# Patient Record
Sex: Female | Born: 1985 | Race: White | Hispanic: No | Marital: Single | Smoking: Never smoker
Health system: Southern US, Community
[De-identification: ages and names within clinical notes are randomized; demographics above are authoritative.]

## PROBLEM LIST (undated history)

## (undated) DIAGNOSIS — D649 Anemia, unspecified: Secondary | ICD-10-CM

## (undated) DIAGNOSIS — N200 Calculus of kidney: Secondary | ICD-10-CM

## (undated) DIAGNOSIS — R42 Dizziness and giddiness: Secondary | ICD-10-CM

## (undated) HISTORY — DX: Calculus of kidney: N20.0

## (undated) HISTORY — PX: KNEE SURGERY: SHX244

## (undated) HISTORY — DX: Anemia, unspecified: D64.9

## (undated) HISTORY — DX: Dizziness and giddiness: R42

---
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
## 2011-10-04 ENCOUNTER — Encounter (HOSPITAL_COMMUNITY): Payer: Self-pay

## 2011-10-04 ENCOUNTER — Emergency Department (HOSPITAL_COMMUNITY)
Admission: EM | Admit: 2011-10-04 | Discharge: 2011-10-04 | Disposition: A | Discharge status: Home / Self Care | Payer: BC Managed Care – PPO | Attending: Emergency Medicine | Admitting: Emergency Medicine

## 2011-10-04 ENCOUNTER — Emergency Department (HOSPITAL_COMMUNITY): Payer: BC Managed Care – PPO

## 2011-10-04 DIAGNOSIS — N1330 Unspecified hydronephrosis: Secondary | ICD-10-CM | POA: Insufficient documentation

## 2011-10-04 DIAGNOSIS — R109 Unspecified abdominal pain: Secondary | ICD-10-CM | POA: Insufficient documentation

## 2011-10-04 DIAGNOSIS — N200 Calculus of kidney: Secondary | ICD-10-CM | POA: Insufficient documentation

## 2011-10-04 DIAGNOSIS — R1031 Right lower quadrant pain: Secondary | ICD-10-CM | POA: Insufficient documentation

## 2011-10-04 LAB — URINALYSIS, ROUTINE W REFLEX MICROSCOPIC
Bilirubin Urine: NEGATIVE
Glucose, UA: NEGATIVE mg/dL
Ketones, ur: 15 mg/dL — AB
Leukocytes, UA: NEGATIVE
Nitrite: NEGATIVE
Protein, ur: NEGATIVE mg/dL
Specific Gravity, Urine: 1.025 (ref 1.005–1.030)
Urobilinogen, UA: 1 mg/dL (ref 0.0–1.0)
pH: 7 (ref 5.0–8.0)

## 2011-10-04 LAB — DIFFERENTIAL
Basophils Absolute: 0 10*3/uL (ref 0.0–0.1)
Basophils Relative: 0 % (ref 0–1)
Lymphocytes Relative: 8 % — ABNORMAL LOW (ref 12–46)
Monocytes Absolute: 0.3 10*3/uL (ref 0.1–1.0)
Neutro Abs: 9.6 10*3/uL — ABNORMAL HIGH (ref 1.7–7.7)

## 2011-10-04 LAB — POCT PREGNANCY, URINE: Preg Test, Ur: NEGATIVE

## 2011-10-04 LAB — BASIC METABOLIC PANEL
CO2: 25 mEq/L (ref 19–32)
Calcium: 9.3 mg/dL (ref 8.4–10.5)
Chloride: 101 mEq/L (ref 96–112)
Creatinine, Ser: 0.6 mg/dL (ref 0.50–1.10)
GFR calc Af Amer: >90 mL/min (ref >90)
Sodium: 137 mEq/L (ref 135–145)

## 2011-10-04 LAB — CBC
HCT: 37.8 % (ref 36.0–46.0)
MCHC: 34.7 g/dL (ref 30.0–36.0)
Platelets: 281 10*3/uL (ref 150–400)
RDW: 13.5 % (ref 11.5–15.5)
WBC: 10.8 10*3/uL — ABNORMAL HIGH (ref 4.0–10.5)

## 2011-10-04 LAB — URINE MICROSCOPIC-ADD ON

## 2011-10-04 IMAGING — CT CT ABD-PELV W/O CM
2 of 4 series · 16 of 46 positions shown, 18 images · non-contrast
Comparison: None.

CLINICAL DATA: Severe right flank pain for 1 day.

CT ABDOMEN AND PELVIS WITHOUT CONTRAST
TECHNIQUE: Multidetector CT imaging of the abdomen and pelvis was
performed following the standard protocol without intravenous
contrast.

[Series 2: stone 160 5.0 b31f st · axial · 0.69mm/px · z∈[-520,-76]mm · 13 of 99 slices shown, 15 images]
[im 5/99  soft-tissue]
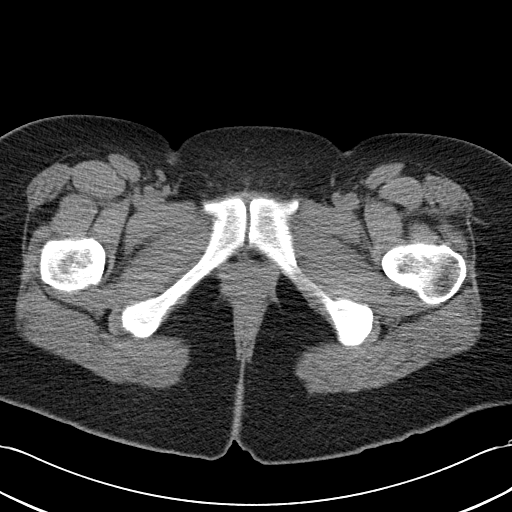
[im 5/99  bone]
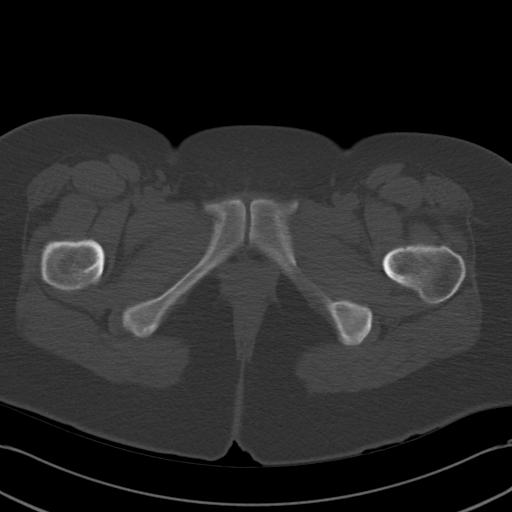
[im 13/99  soft-tissue]
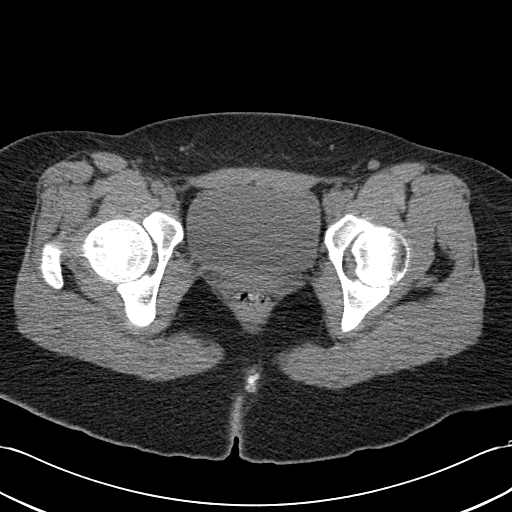
[im 21/99  soft-tissue]
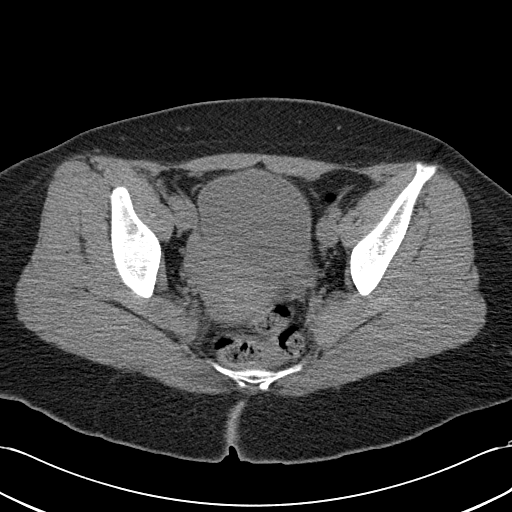
[im 29/99  soft-tissue]
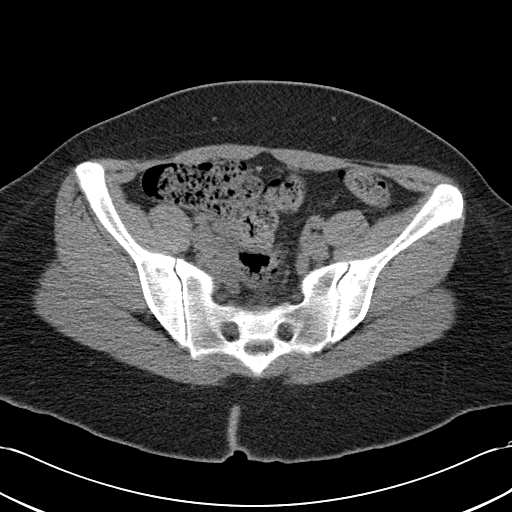
[im 33/99  soft-tissue]
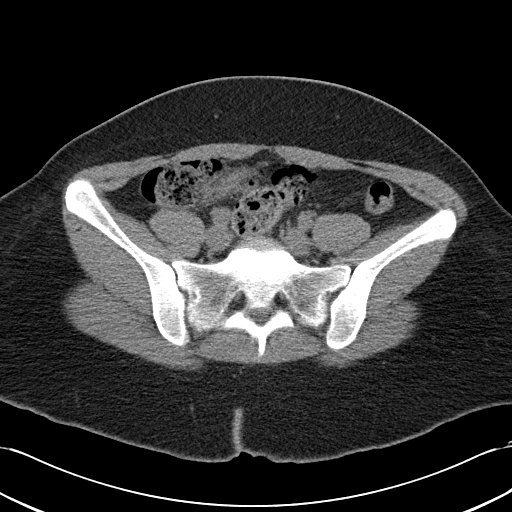
[im 41/99  soft-tissue]
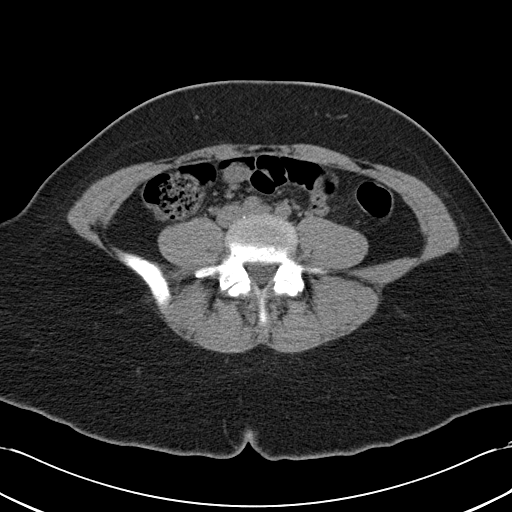
[im 50/99  soft-tissue]
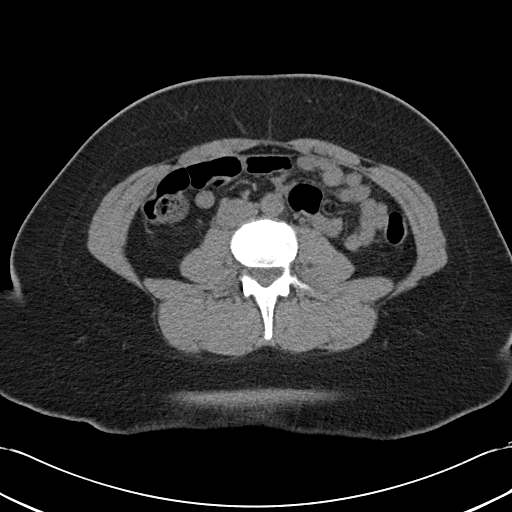
[im 58/99  soft-tissue]
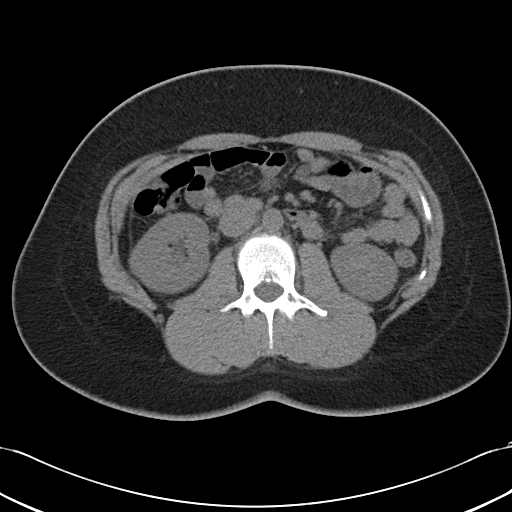
[im 66/99  soft-tissue]
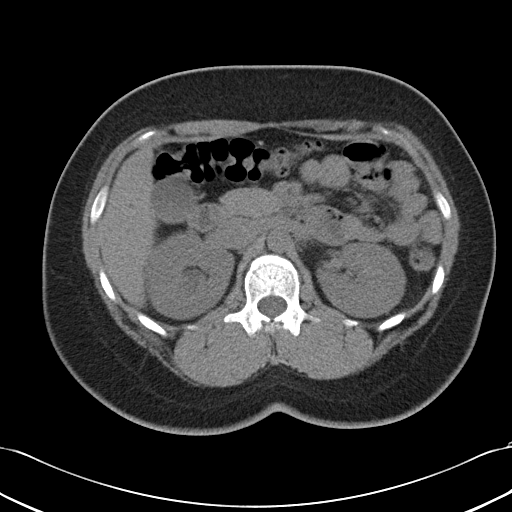
[im 66/99  bone]
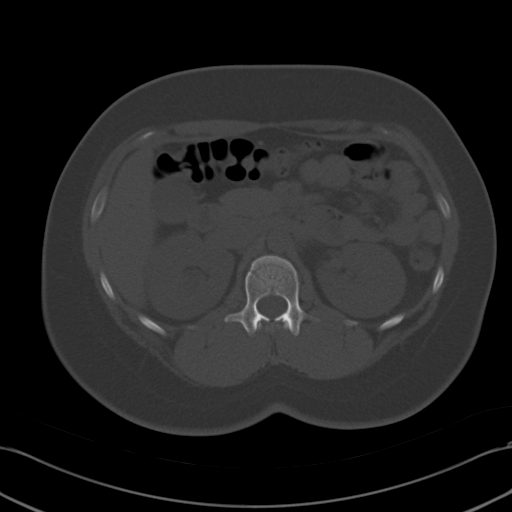
[im 70/99  soft-tissue]
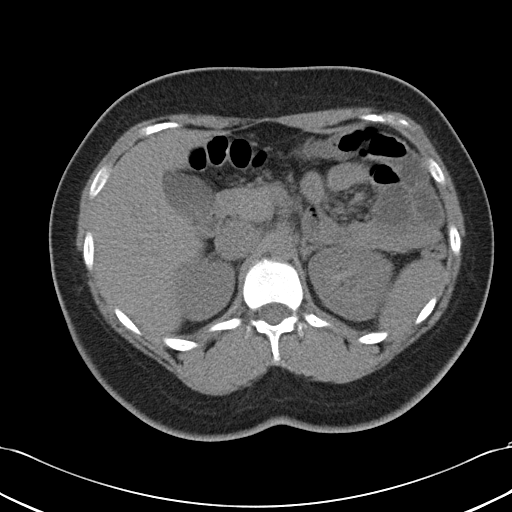
[im 78/99  soft-tissue]
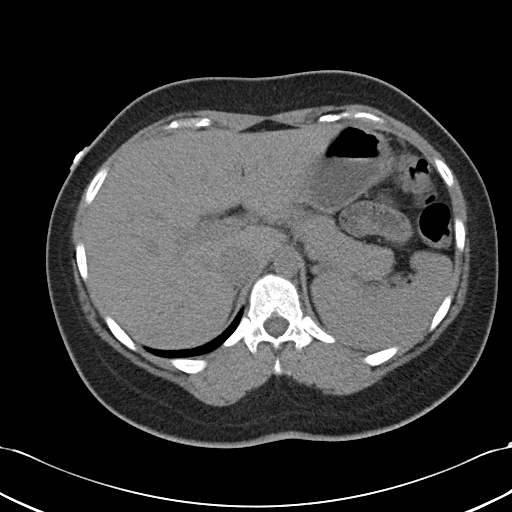
[im 86/99  soft-tissue]
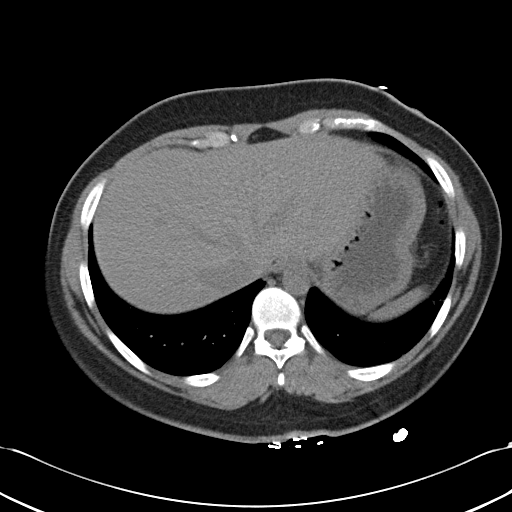
[im 94/99  soft-tissue]
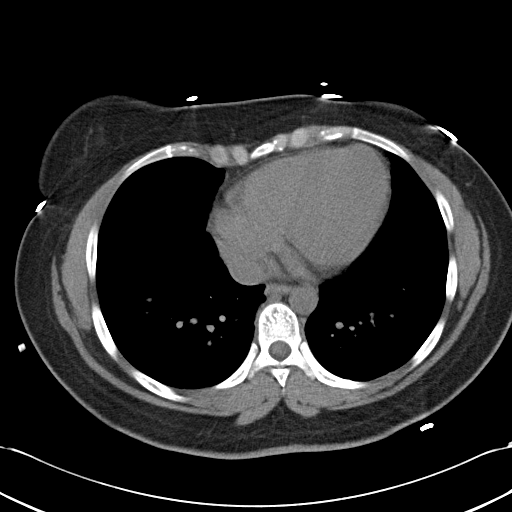

[Series 5: coronals cor · coronal · 0.67mm/px · 3 of 130 slices shown]
[im 44/130  soft-tissue]
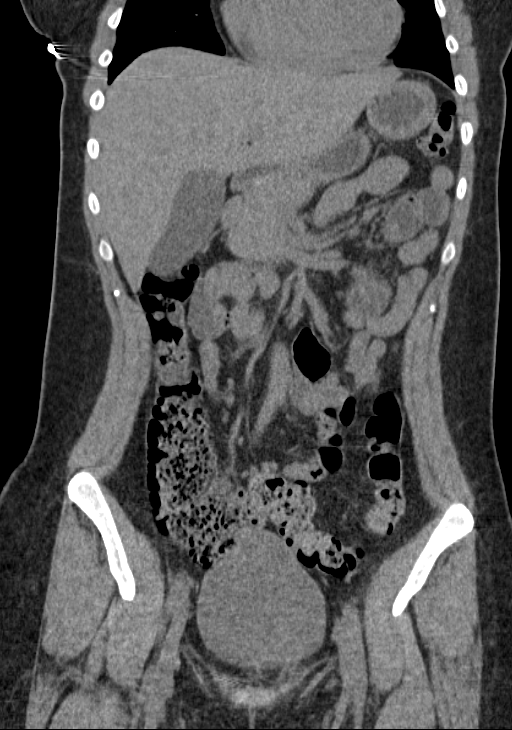
[im 58/130  soft-tissue]
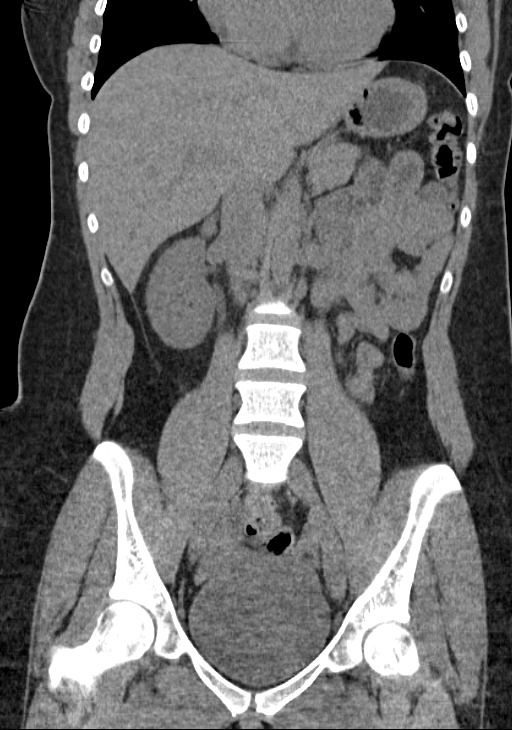
[im 72/130  soft-tissue]
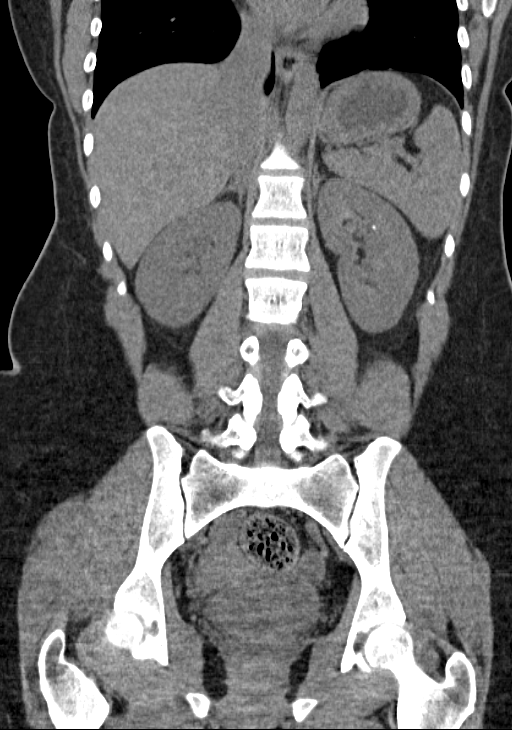

[16 of 46 positions shown; findings below may reference images not displayed]

FINDINGS: Lung bases demonstrate only some dependent atelectasis.
No pleural or pericardial effusion.

Two punctate nonobstructing left renal stones are identified.  No
hydronephrosis or ureteral stone.  The patient has mild right
hydronephrosis.  There is a punctate stone in the proximal right
ureter measuring approximately 0.2 cm.  It is seen on image 48 of
series 2 and is at the level of the L3 vertebral body.  No other
urinary tract stones.

The gallbladder, liver, spleen, adrenal glands and pancreas appear
normal.  No lymphadenopathy or fluid.  Uterus, adnexa and urinary
bladder are unremarkable.  The stomach and small large bowel are
normal in appearance.  No evidence inflammatory process is
identified.  No focal bony abnormality.
IMPRESSION: 1.  Mild right hydronephrosis due to a punctate stone in the right
ureter at the level of L3.
2.  Two punctate nonobstructing left renal stones.

## 2011-10-04 MED ORDER — MORPHINE SULFATE 4 MG/ML IJ SOLN
4.0000 mg | Freq: Once | INTRAMUSCULAR | Status: AC
  Administered 2011-10-04: 4 mg via INTRAVENOUS
  Filled 2011-10-04: qty 1

## 2011-10-04 MED ORDER — ONDANSETRON 8 MG PO TBDP: 8.0000 mg | ORAL_TABLET | Freq: Three times a day (TID) | ORAL | Status: AC | PRN

## 2011-10-04 MED ORDER — SODIUM CHLORIDE 0.9 % IV SOLN: INTRAVENOUS | Status: DC

## 2011-10-04 MED ORDER — KETOROLAC TROMETHAMINE 30 MG/ML IJ SOLN
30.0000 mg | Freq: Once | INTRAMUSCULAR | Status: AC
  Administered 2011-10-04: 30 mg via INTRAVENOUS
  Filled 2011-10-04: qty 1

## 2011-10-04 MED ORDER — TAMSULOSIN HCL 0.4 MG PO CAPS: 0.4000 mg | ORAL_CAPSULE | Freq: Every day | ORAL | Status: DC

## 2011-10-04 MED ORDER — ONDANSETRON HCL 4 MG/2ML IJ SOLN
4.0000 mg | Freq: Once | INTRAMUSCULAR | Status: AC
  Administered 2011-10-04: 4 mg via INTRAVENOUS
  Filled 2011-10-04: qty 2

## 2011-10-04 MED ORDER — SODIUM CHLORIDE 0.9 % IV BOLUS (SEPSIS)
1000.0000 mL | Freq: Once | INTRAVENOUS | Status: AC
  Administered 2011-10-04: 1000 mL via INTRAVENOUS

## 2011-10-04 MED ORDER — OXYCODONE-ACETAMINOPHEN 5-325 MG PO TABS: 1.0000 | ORAL_TABLET | Freq: Four times a day (QID) | ORAL | Status: AC | PRN

## 2011-10-04 NOTE — ED Notes (Signed)
Pt c/o discomfort at IV site, gave her warm compress

## 2011-10-04 NOTE — ED Provider Notes (Signed)
26 year old female who had a sudden onset this morning of severe right flank pain radiating to the right lower abdomen. There is associated nausea and vomiting. No fever or chills. On exam, she is currently in no acute distress. She has mild right CVA tenderness and mild right lower quadrant tenderness. There is no rebound or guarding. Peristalsis is active. Symptoms are suspicious for ureterolithiasis and urinalysis will be obtained as well as CT scan. She'll be given hydromorphone, ketorolac, and ondansetron for symptom control.  Dione Booze, MD 10/04/11 [phone]

## 2011-10-04 NOTE — ED Notes (Signed)
Pt complains of abd pain and flank area pain, sts due anyting fro menstrual cycle but is very irregular, vomiting.

## 2011-10-04 NOTE — ED Provider Notes (Signed)
History     CSN: [id number]  Arrival date & time 10/04/11  1346   First MD Initiated Contact with Patient 10/04/11 1535      Chief Complaint  Patient presents with  . Flank Pain  . Abdominal Pain    (Consider location/radiation/quality/duration/timing/severity/associated sxs/prior treatment) HPI Comments: Patient comes in today with a chief complaint of RLQ abdominal pain and right flank pain.  She feel that the pain begins in the right flank area and radiates to the RLQ.  She describes the pain as sharp and reports that it "comes in waves."  She reports that she has also had three episodes of vomiting today and also some diarrhea earlier today.  She states that she taken Midol for the pain, which helped somewhat.  No prior history of kidney stones.  No prior abdominal surgeries.  No prior history of ovarian cysts.  She denies any hematuria.  She denies any urinary frequency or urgency.  She denies any fever or chills.  She reports that her LMP was sometime at the end of April.  She reports that she is not sexually active.  Her PCP is Dr.  Ludwig Clarks at [facility].  The history is provided by the patient.    History reviewed. No pertinent past medical history.  History reviewed. No pertinent past surgical history.  History reviewed. No pertinent family history.  History  Substance Use Topics  . Smoking status: Never Smoker   . Smokeless tobacco: Not on file  . Alcohol Use: No    OB History    Grav Para Term Preterm Abortions TAB SAB Ect Mult Living                  Review of Systems  Constitutional: Positive for diaphoresis. Negative for fever and chills.  Respiratory: Negative for shortness of breath.   Cardiovascular: Negative for chest pain.  Gastrointestinal: Positive for nausea, vomiting, abdominal pain and diarrhea. Negative for constipation and blood in stool.  Genitourinary: Positive for flank pain. Negative for dysuria, urgency, frequency, hematuria,  decreased urine volume, vaginal bleeding, vaginal discharge, difficulty urinating and menstrual problem.  Neurological: Negative for dizziness, syncope and light-headedness.    Allergies  Review of patient's allergies indicates no known allergies.  Home Medications  No current outpatient prescriptions on file.  BP 119/75  Pulse 43  Temp(Src) 98.1 F (36.7 C) (Oral)  Resp 18  SpO2 97%  LMP 09/01/2011  Physical Exam  Nursing note and vitals reviewed. Constitutional: She appears well-developed and well-nourished.  HENT:  Head: Normocephalic and atraumatic.  Mouth/Throat: Oropharynx is clear and moist.  Neck: Normal range of motion. Neck supple.  Cardiovascular: Normal rate, regular rhythm and normal heart sounds.   Pulmonary/Chest: Effort normal and breath sounds normal.  Abdominal: Soft. Normal appearance and bowel sounds are normal. She exhibits no mass. There is tenderness in the right lower quadrant. There is CVA tenderness. There is no rigidity, no rebound, no guarding and negative Murphy's sign.       Right CVA tenderness  Neurological: She is alert.  Skin: Skin is warm and dry.  Psychiatric: She has a normal mood and affect.    ED Course  Procedures (including critical care time)   Labs Reviewed  URINALYSIS, ROUTINE W REFLEX MICROSCOPIC   Ct Abdomen Pelvis Wo Contrast  10/04/2011  *RADIOLOGY REPORT*  Clinical Data: Severe right flank pain for 1 day.  CT ABDOMEN AND PELVIS WITHOUT CONTRAST  Technique:  Multidetector CT imaging of  the abdomen and pelvis was performed following the standard protocol without intravenous contrast.  Comparison: None.  Findings: Lung bases demonstrate only some dependent atelectasis. No pleural or pericardial effusion.  Two punctate nonobstructing left renal stones are identified.  No hydronephrosis or ureteral stone.  The patient has mild right hydronephrosis.  There is a punctate stone in the proximal right ureter measuring approximately 0.2  cm.  It is seen on image 48 of series 2 and is at the level of the L3 vertebral body.  No other urinary tract stones.  The gallbladder, liver, spleen, adrenal glands and pancreas appear normal.  No lymphadenopathy or fluid.  Uterus, adnexa and urinary bladder are unremarkable.  The stomach and small large bowel are normal in appearance.  No evidence inflammatory process is identified.  No focal bony abnormality.  IMPRESSION:  1.  Mild right hydronephrosis due to a punctate stone in the right ureter at the level of L3. 2.  Two punctate nonobstructing left renal stones.  Original Report Authenticated By: Bernadene Bell. Maricela Curet, M.D.     No diagnosis found.  6:03 PM Reassessed patient.  She reports that her pain and nausea have improved at this time.  MDM  Pt has been diagnosed with a Kidney Stone via CT. There is no evidence of significant hydronephrosis, serum creatine WNL, vitals sign stable and the pt does not have irratractable vomiting. Pt will be dc home with pain medications, antiemetics, and flomax & has been advised to follow up with Urology.          Pascal Lux [place], PA-C 10/05/11 [phone]

## 2011-10-04 NOTE — ED Notes (Signed)
Pt to CT via stretcher.  St's pain has subsided at this time

## 2011-10-04 NOTE — Discharge Instructions (Signed)
You have been diagnosed with kidney stones.  Use your pain medication as prescribed and do not operate heavy machinery while on this medication. Note that your pain medication contains Acetaminophen (Tylenol), therefore it is not recommended to take additional Tylenol while on your pain medication. Continue to drink fluids to help you pass the stones.  Use Zofran for nausea as directed.  Followup with your primary care doctor in regards to your hospital visit.  Return to the ED immediately if you develop fever that persists > 101, uncontrolled pain or vomiting, or other concerns.  Read the instructions below to learn more about kidney stones.  If you do not have a primary care doctor to followup with, use the resource guide attached to help you find one.   Kidney Stones Kidney stones (ureteral lithiasis) are solid masses that form inside your kidneys. The intense pain is caused by the stone moving through the kidney, ureter, bladder, and urethra (urinary tract). When the stone moves, the ureter starts to spasm around the stone. The stone is usually passed in the urine.  HOME CARE  Drink enough fluids to keep your pee (urine) clear or pale yellow. This helps to get the stone out.   Only take medicine as told by your doctor.   Follow up with your doctor as told.  GET HELP RIGHT AWAY IF:   Your pain does not get better with medicine.   You have a fever.   Your pain increases and gets worse over 18 hours.   You have new belly (abdominal) pain.   You feel faint or pass out.  MAKE SURE YOU:   Understand these instructions.   Will watch your condition.   Will get help right away if you are not doing well or get worse.   RESOURCE GUIDE  Dental Problems  Patients with Medicaid: [facility]                     [facility] [address]                                           [address] Phone:  [phone]                                                   Phone:  [phone]  If unable to pay or uninsured, contact:  [facility] or [facility] to become qualified for the adult dental clinic.  Chronic Pain Problems Contact [facility]  [phone] Patients need to be referred by their primary care doctor.  Insufficient Money for Medicine Contact [employer]:  call "211" or [facility] 271-5999.  No Primary Care Doctor Call [facility]  832-8000 Other agencies that provide inexpensive medical care    [facility]  [phone]    [facility]  [phone]    [facility]  [phone]    Women's Clinic  [phone]    [facility]  [phone]    [facility]  [phone]  Psychological Services [facility]  [phone] [facility]  [phone] [facility]   800 853-5163 (emergency services 641-4993)    Substance Abuse Resources [facility]  336-882-2125 [facility] 336-784-9470 [facility] 336-285-9073 [facility] 336-845-3988 Residential & Outpatient Substance Abuse Program  800-659-3381  Abuse/Neglect [place] Child Abuse Hotline (336) 641-3795 [place] Child Abuse Hotline 800-378-5315 (After Hours)  Emergency Shelter [facility] (336) 271-5985  [facility] (336) 275-9566 [facility] (704) 372-4663  MRSA Hotline #:   832-7006    [place] Resources  [facility] [place]     [employer]                          [facility] [address] [place]                       [address]      [address]                                                  [place]                            [place] Phone:  [phone]                                   Phone:  [phone]                 Phone:  [phone]  [facility] Phone:   [phone]  [place] Child Abuse Hotline (336) 342-1394 (336) 342-3537 (After Hours)   

## 2011-10-08 NOTE — ED Provider Notes (Signed)
Medical screening examination/treatment/procedure(s) were conducted as a shared visit with non-physician practitioner(s) and myself.  I personally evaluated the patient during the encounter   Dione Booze, MD 10/08/11 [phone]

## 2018-06-20 ENCOUNTER — Other Ambulatory Visit (HOSPITAL_COMMUNITY)
Admission: RE | Admit: 2018-06-20 | Discharge: 2018-06-20 | Disposition: A | Discharge status: Home / Self Care | Payer: BC Managed Care – PPO | Source: Ambulatory Visit | Attending: Obstetrics and Gynecology | Admitting: Obstetrics and Gynecology

## 2018-06-20 ENCOUNTER — Other Ambulatory Visit: Payer: Self-pay | Admitting: Obstetrics and Gynecology

## 2018-06-20 DIAGNOSIS — Z01419 Encounter for gynecological examination (general) (routine) without abnormal findings: Secondary | ICD-10-CM | POA: Insufficient documentation

## 2018-06-22 LAB — CYTOLOGY - PAP
DIAGNOSIS: NEGATIVE
HPV: NOT DETECTED

## 2021-05-19 ENCOUNTER — Ambulatory Visit: Payer: BC Managed Care – PPO | Admitting: Neurology

## 2021-05-19 ENCOUNTER — Other Ambulatory Visit: Payer: Self-pay

## 2021-05-19 ENCOUNTER — Encounter: Payer: Self-pay | Admitting: Neurology

## 2021-05-19 VITALS — BP 128/79 | HR 68 | Resp 20 | Ht 65.0 in | Wt 259.0 lb

## 2021-05-19 DIAGNOSIS — R55 Syncope and collapse: Secondary | ICD-10-CM

## 2021-05-19 NOTE — Progress Notes (Signed)
NEUROLOGY CONSULTATION NOTE  Alexis Yoder Flenner MRN: [id number] DOB: [birth year]  Referring provider: Dr. Garth BignessKathryn Timberlake Primary care provider: Dr. Garth BignessKathryn Timberlake  Reason for consult:  2 episodes of syncope  Dear Dr Chanetta Marshallimberlake:  Thank you for your kind referral of Alexis Yoder Pfister for consultation of the above symptoms. Although her history is well known to you, please allow me to reiterate it for the purpose of our medical record. The patient was accompanied to the clinic by her mother who also provides collateral information. Records and images were personally reviewed where available.   HISTORY OF PRESENT ILLNESS: This is a pleasant 36 year old right-handed woman with no significant past medical history, presenting for evaluation of 2 episodes of syncope. She is accompanied by her mother who helps supplement the history today. She recalls having a syncopal episode in middle school, her mother was braiding her hair by the sink and she fell forward. She recalls that she was getting ready to start her period at that time. She only recalls feeling sleepy, no focal weakness. She had another syncopal episode in late high school/early college with no prior warning, she was told she was anemic and took iron pills for a time. She did well for several years until 04/23/2021. She recalls feeling fine that day, she saw her chiropractor who did adjustments (in her back and neck), then stopped by [employer] for food. She recalls feeling a little nauseated in the car, which is not unusual with history of carsickness. They were visiting her grandmother in [place] and arrived there feeling hungry. She had dinner and then while sitting felt like the room was spinning. She alerted her mother and recalls feeling very warm. Her mother put a wet washcloth on, she felt fine for 30 minutes and started crocheting, when she again felt the same sensation and lost consciousness, waking up to her mother calling her.  Her mother reports that she was sitting and then her head went down and her arms flexed on her chest, no convulsive activity. Episode lasted 15-20 seconds. She woke up and felt warm again, vomiting twice en route to the ER. She recalls having a head CT and EKG that were reportedly normal, diagnosed with vertigo and discharged home on meclizine. Over the next month, she would have brief episodes of feeling "swimmyheaded" and would take meclizine which seemed to curb sensation. No further spinning sensation until 05/10/2021. She felt fine, she had been up and down steps helping pack the house. At dinner, she ate and was sitting on the recliner when she felt the same spinning sensation. She took meclizine and felt fine for 10-20 minutes. She got up and sat on the kitchen chair then felt the spinning sensation and again lost consciousness also for only a few seconds. She did not fall off the chair, she was able to lay her head on the back of the chair and felt warm and nauseated when she woke up. Her body felt like she had run a marathon, she slept until the next day. There was a little burning sensation in her chest, no palpitations. She still has a little bit of the swimmy-headed sensation right now when she moves her head back and forth. 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 Her mother denies any staring/unresponsive  episodes. She denies any focal numbness/tingling/weakness, myoclonic jerks. She has occasional tension headaches. She denies any neck pain. She usually gets 6-7 hours of sleep, melatonin helps. She denies any sleep deprivation or alcohol prior to the events. Memory is really good. 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 She had a normal birth and early development.  There is no  history of febrile convulsions, CNS infections such as meningitis/encephalitis, significant traumatic brain injury, neurosurgical procedures, or family history of seizures.   PAST MEDICAL HISTORY: History reviewed. No pertinent past medical history.  PAST SURGICAL HISTORY: History reviewed. No pertinent surgical history.  MEDICATIONS: Current Outpatient Medications on File Prior to Visit  Medication Sig Dispense Refill   cetirizine (ZYRTEC) 10 MG tablet 1 tablet     fluticasone (FLONASE) 50 MCG/ACT nasal spray fluticasone propionate 50 mcg/actuation nasal spray,suspension     meclizine (ANTIVERT) 25 MG tablet Take 25 mg by mouth 3 (three) times daily as needed.     Melatonin 10 MG CAPS Take by mouth. Takes 2 gummys at hs     Multiple Vitamin (MULTIVITAMIN) tablet Take 1 tablet by mouth daily.     No current facility-administered medications on file prior to visit.    ALLERGIES: Allergies  Allergen Reactions   Nickel Rash and Other (See Comments)    FAMILY HISTORY: History reviewed. No pertinent family history.  SOCIAL HISTORY: Social History   Socioeconomic History   Marital status: Single    Spouse name: Not on file   Number of children: 0   Years of education: Not on file   Highest education level: Not on file  Occupational History   Occupation: teacher special education  Tobacco Use   Smoking status: Never   Smokeless tobacco: Not on file  Vaping Use   Vaping Use: Never used  Substance and Sexual Activity   Alcohol use: No   Drug use: No   Sexual activity: Not on file  Other Topics Concern   Not on file  Social History Narrative   Right handed   Drinks caffeine   One story home   Social Determinants of Health   Financial Resource Strain: Not on file  Food Insecurity: Not on file  Transportation Needs: Not on file  Physical Activity: Not on file  Stress: Not on file  Social Connections: Not on file  Intimate Partner Violence: Not on file      PHYSICAL EXAM: Vitals:   05/19/21 1259  BP: 128/79  Pulse: 68  Resp: 20  SpO2: 98%   General: No acute distress Head:  Normocephalic/atraumatic Skin/Extremities: No rash, no edema Neurological Exam: Mental status: alert and oriented to person, place, and time, no dysarthria or aphasia, Fund of knowledge is appropriate.  Recent and remote memory are intact, 3/3 delayed recall.  Attention and concentration are normal, 5/5 delayed recall. Cranial nerves: CN I: not tested CN II: pupils equal, round and reactive to light, visual fields intact CN III, IV, VI:  full range of motion, no nystagmus, no ptosis CN V: facial sensation intact CN VII: upper and lower face symmetric CN VIII: hearing intact to conversation Bulk & Tone: normal, no fasciculations. Motor: 5/5 throughout with no pronator drift. Sensation: intact to light touch, cold, pin, vibration sense.  No extinction to double simultaneous stimulation.  Romberg test negative Deep Tendon Reflexes: +2 throughout, no ankle clonus Plantar responses: downgoing bilaterally Cerebellar: no incoordination on finger to nose testing Gait: narrow-based and  steady, able to tandem walk adequately. Tremor: none   IMPRESSION: This is a pleasant 36 year old right-handed woman with no significant past medical history, presenting for evaluation of 2 episodes of syncope. She reports smelling something burning and having a wave of spinning sensation prior to the episodes, raising concern for seizure, however the brief nature and post-event symptoms also raise possibility of other causes of syncope. From a neurological standpoint, MRI brain with and without contrast and 1-hour EEG will be ordered. If normal, we will do a 24-hour EEG. She will also be referred to Cardiology for evaluation of recurrent syncope. [place] driving laws were discussed with the patient, and she knows to stop driving after an episode of loss of consciousness until 6 months  event-free. Follow-up after tests, call for any changes.    Thank you for allowing me to participate in the care of this patient. Please do not hesitate to call for any questions or concerns.   Patrcia Dolly, M.D.  CC: Dr. Chanetta Marshall

## 2021-05-19 NOTE — Patient Instructions (Signed)
Good to meet you!  Schedule MRI brain with and without contrast  2. Schedule 1-hour EEG, if normal we will do a 24-hour EEG  3. Refer to Cardiology  4. As per [place] driving laws, no driving after an episode of loss of consciousness until 6 months event-free  5. Follow-up after tests, call for any changes

## 2021-05-20 ENCOUNTER — Other Ambulatory Visit: Payer: Self-pay

## 2021-05-20 DIAGNOSIS — R4020 Unspecified coma: Secondary | ICD-10-CM

## 2021-05-21 ENCOUNTER — Other Ambulatory Visit: Payer: Self-pay

## 2021-05-21 ENCOUNTER — Ambulatory Visit (INDEPENDENT_AMBULATORY_CARE_PROVIDER_SITE_OTHER): Payer: BC Managed Care – PPO | Admitting: Neurology

## 2021-05-21 DIAGNOSIS — R55 Syncope and collapse: Secondary | ICD-10-CM | POA: Diagnosis not present

## 2021-05-27 ENCOUNTER — Telehealth: Payer: Self-pay | Admitting: Neurology

## 2021-05-27 NOTE — Telephone Encounter (Signed)
Patient called stating she is just following up from her EEG that she had done.

## 2021-05-28 NOTE — Telephone Encounter (Signed)
Pls let her know the 1-hour EEG was normal, it is not like a pregnancy test that is positive or negative, just a snapshot of her brainwaves during the study. Proceed with 24-hour EEG as discussed, thanks

## 2021-05-28 NOTE — Telephone Encounter (Signed)
Pt called an informed 1-hour EEG was normal, it is not like a pregnancy test that is positive or negative, just a snapshot of her brainwaves during the study. Proceed with 24-hour EEG as discussed. Pt verbalized understanding

## 2021-05-28 NOTE — Procedures (Signed)
ELECTROENCEPHALOGRAM REPORT  Date of Study: 05/21/2021  Patient's Name: Alexis Yoder MRN: [id number] Date of Birth: 03/29/1986  Referring Provider: Dr. Patrcia Dolly  Clinical History: This is a 36 year old woman with 2 episodes of syncope preceded by spinning sensation. EEG for classification.  Medications: meclizine  Technical Summary: A multichannel digital 1-hour EEG recording measured by the international 10-20 system with electrodes applied with paste and impedances below 5000 ohms performed in our laboratory with EKG monitoring in an awake and asleep patient.  Hyperventilation was not performed. Photic stimulation was performed.  The digital EEG was referentially recorded, reformatted, and digitally filtered in a variety of bipolar and referential montages for optimal display.    Description: The patient is awake and asleep during the recording.  During maximal wakefulness, there is a symmetric, medium voltage 10 Hz posterior dominant rhythm that attenuates with eye opening.  The record is symmetric.  During drowsiness and sleep, there is an increase in theta slowing of the background.  Vertex waves and symmetric sleep spindles were seen.  Photic stimulation did not elicit any abnormalities.  There were no epileptiform discharges or electrographic seizures seen.    EKG lead was unremarkable.  Impression: This 1-hour awake and asleep EEG is normal.    Clinical Correlation: A normal EEG does not exclude a clinical diagnosis of epilepsy.  If further clinical questions remain, prolonged EEG may be helpful.  Clinical correlation is advised.   Patrcia Dolly, M.D.

## 2021-05-30 ENCOUNTER — Ambulatory Visit: Payer: BC Managed Care – PPO | Admitting: Cardiology

## 2021-05-30 ENCOUNTER — Encounter: Payer: Self-pay | Admitting: Cardiology

## 2021-05-30 ENCOUNTER — Other Ambulatory Visit: Payer: Self-pay

## 2021-05-30 VITALS — BP 135/84 | HR 75 | Temp 98.3°F | Resp 16 | Ht 65.0 in | Wt 259.8 lb

## 2021-05-30 DIAGNOSIS — R42 Dizziness and giddiness: Secondary | ICD-10-CM

## 2021-05-30 DIAGNOSIS — R55 Syncope and collapse: Secondary | ICD-10-CM

## 2021-05-30 DIAGNOSIS — Z6841 Body Mass Index (BMI) 40.0 and over, adult: Secondary | ICD-10-CM

## 2021-05-30 DIAGNOSIS — R4020 Unspecified coma: Secondary | ICD-10-CM

## 2021-05-30 NOTE — Progress Notes (Signed)
Date:  05/30/2021   ID:  Alexis Yoder, DOB [birth year], MRN [id number]  PCP:  Glenis Smoker, MD  Cardiologist:  Rex Kras, DO, [facility] (established care May 30, 2021)  REASON FOR CONSULT: Syncope  REQUESTING PHYSICIAN:  Glenis Smoker, MD [address] [place],  [place] [address]  Chief Complaint  Patient presents with   Near Syncope   New Patient (Initial Visit)    HPI  Alexis Yoder is a 35 y.o. Caucasian female who presents to the office with a chief complaint of " syncope work-up and evaluation." Patient's past medical history and cardiovascular risk factors include: Obesity due to excess calories, Vertigo.   She is referred to the office at the request of Glenis Smoker, * for evaluation of Syncope.  This patient is accompanied in the office by her  mom . Alexis Yoder provides verbal consent with regards to having her present during today's encounter.  On April 23, 2021 patient had gone to her chiropractor who worked on her back and neck and later that day she was in the car traveling to [place] to visit family.  When she arrived to her grandma's place she was hungry they sat down to eat dinner that is when she started experiencing her head spinning, feeling warm/flushed.  Her mother placed a warm washcloth on her forehead she felt fine for the first half hour only for symptoms to recur and while seated lost consciousness for approximately 15 to 20 seconds according to the patient's mother who witnessed the event.  In route to the ED patient had 2 episodes of vomiting.  She underwent a work-up at the local ED and was diagnosed with vertigo and sent home on meclizine.  She had another episode of syncope on May 10, 2021.  After eating dinner and sitting on the recliner he started having symptoms of spinning like sensation she took a meclizine shortly thereafter and the symptoms resolved.  When she got up and sat on the kitchen chair she had  reoccurrence of symptoms and then lost consciousness only for a few seconds.  Patient's mother notes that the recent 2 episodes of syncope have been occurring after eating a meal, usually while she is sitting, prodromal symptoms including sensation of spinning and feeling warm/flushed, and aura of something burning.  Patient denies any lightheadedness, dizziness, tunnel vision, nausea, or loss of bowel or bladder function.  Patient has been referred to neurology and plans to undergo EEG and MRI of the brain.  And also referred to cardiology for further evaluation.  She used to have a history of anemia while growing up which is now resolved.  No prior thyroid disease.    Patient states that she is usually not well hydrated and could improve her overall hydration.  Consumes approximately 1 soda daily, and 12 ounce of coffee daily.  No use of herbal supplements, energy drinks, stimulants, or weight loss supplements.  No recent sick contacts.  No family history of premature CAD/cardiomyopathy/sudden cardiac death.  FUNCTIONAL STATUS: No structured exercise program or daily routine.   ALLERGIES: Allergies  Allergen Reactions   Nickel Rash and Other (See Comments)    MEDICATION LIST PRIOR TO VISIT: Current Meds  Medication Sig   cetirizine (ZYRTEC) 10 MG tablet 1 tablet   fluticasone (FLONASE) 50 MCG/ACT nasal spray fluticasone propionate 50 mcg/actuation nasal spray,suspension   meclizine (ANTIVERT) 25 MG tablet Take 25 mg by mouth 3 (three) times daily as needed.  Melatonin 10 MG CAPS Take by mouth. Takes 2 gummys at hs   Multiple Vitamin (MULTIVITAMIN) tablet Take 1 tablet by mouth daily.     PAST MEDICAL HISTORY: Past Medical History:  Diagnosis Date   Anemia    Kidney stone    Vertigo     PAST SURGICAL HISTORY: Past Surgical History:  Procedure Laterality Date   KNEE SURGERY Left     FAMILY HISTORY: The patient family history includes Breast cancer in her mother;  Diabetes in her father.  SOCIAL HISTORY:  The patient  reports that she has never smoked. She does not have any smokeless tobacco history on file. She reports current alcohol use. She reports that she does not use drugs.  REVIEW OF SYSTEMS: Review of Systems  Constitutional: Negative for chills and fever.  HENT:  Negative for hoarse voice and nosebleeds.   Eyes:  Negative for discharge, double vision and pain.  Cardiovascular:  Positive for syncope (Two since Dec 2022). Negative for chest pain, claudication, dyspnea on exertion, leg swelling, near-syncope, orthopnea, palpitations and paroxysmal nocturnal dyspnea.  Respiratory:  Negative for hemoptysis and shortness of breath.   Musculoskeletal:  Negative for muscle cramps and myalgias.  Gastrointestinal:  Negative for abdominal pain, constipation, diarrhea, hematemesis, hematochezia, melena, nausea and vomiting.  Neurological:  Positive for vertigo. Negative for dizziness and light-headedness.  All other systems reviewed and are negative.  PHYSICAL EXAM: Vitals with BMI 05/30/2021 05/19/2021 10/04/2011  Height 5\' 5"  5\' 5"  -  Weight 259 lbs 13 oz 259 lbs -  BMI [id number] -  Systolic [id number]  Diastolic 84 79 65  Pulse 75 68 56   Orthostatic VS for the past 72 hrs (Last 3 readings):  Orthostatic BP Patient Position BP Location Cuff Size Orthostatic Pulse  05/30/21 1105 123/80 Standing Left Arm Large 66  05/30/21 1104 130/80 Sitting Left Arm Large 65  05/30/21 1103 128/81 Supine Left Arm Large 57   CONSTITUTIONAL: Well-developed and well-nourished. No acute distress.  SKIN: Skin is warm and dry. No rash noted. No cyanosis. No pallor. No jaundice HEAD: Normocephalic and atraumatic.  EYES: No scleral icterus MOUTH/THROAT: Moist oral membranes.  NECK: No JVD present. No thyromegaly noted. No carotid bruits  LYMPHATIC: No visible cervical adenopathy.  CHEST Normal respiratory effort. No intercostal retractions  LUNGS: Clear to  auscultation bilaterally.  No stridor. No wheezes. No rales.  CARDIOVASCULAR: Regular rate and rhythm, positive S1-S2, no murmurs rubs or gallops appreciated. ABDOMINAL: Obese, soft, nontender, nondistended, positive bowel sounds all 4 quadrants, no apparent ascites.  EXTREMITIES: No peripheral edema, warm to touch, 2+ bilateral DP and PT pulses HEMATOLOGIC: No significant bruising NEUROLOGIC: Oriented to person, place, and time. Nonfocal. Normal muscle tone.  PSYCHIATRIC: Normal mood and affect. Normal behavior. Cooperative  CARDIAC DATABASE: EKG: 05/30/2021: Sinus bradycardia, 52 bpm, without underlying ischemia or injury pattern.  Echocardiogram: No results found for this or any previous visit from the past 1095 days.    Stress Testing: No results found for this or any previous visit from the past 1095 days.   Heart Catheterization: None  LABORATORY DATA: CBC Latest Ref Rng & Units 10/04/2011  WBC 4.0 - 10.5 K/uL 10.8(H)  Hemoglobin 12.0 - 15.0 g/dL 13.1  Hematocrit 36.0 - 46.0 % 37.8  Platelets 150 - 400 K/uL 281    CMP Latest Ref Rng & Units 10/04/2011  Glucose 70 - 99 mg/dL 107(H)  BUN 6 - 23 mg/dL 8  Creatinine 0.50 - 1.10  mg/dL 2.130.60  Sodium 086135 - 578145 mEq/L 137  Potassium 3.5 - 5.1 mEq/L 4.0  Chloride 96 - 112 mEq/L 101  CO2 19 - 32 mEq/L 25  Calcium 8.4 - 10.5 mg/dL 9.3    Lipid Panel  No results found for: CHOL, TRIG, HDL, CHOLHDL, VLDL, LDLCALC, LDLDIRECT, LABVLDL  No components found for: NTPROBNP No results for input(s): PROBNP in the last 8760 hours. No results for input(s): TSH in the last 8760 hours.  BMP No results for input(s): NA, K, CL, CO2, GLUCOSE, BUN, CREATININE, CALCIUM, GFRNONAA, GFRAA in the last 8760 hours.  HEMOGLOBIN A1C No results found for: HGBA1C, MPG  External Labs: Collected: 05/15/2021 available in Care Everywhere. TSH 2.62. Hemoglobin 13.5 g/dL, hematocrit 46.9%40.2%. BUN 14, creatinine 0.9. Sodium 139, potassium 4, chloride 102,  bicarb 30. AST 26, ALT 41, alkaline phosphatase 95.  IMPRESSION:    ICD-10-CM   1. Loss of consciousness (HCC)  R40.20 MR ANGIO NECK W WO CONTRAST    EKG 12-Lead       RECOMMENDATIONS: Guillermina CityMaggie C Yoder is a 36 y.o. Caucasian female whose past medical history and cardiac risk factors include: Obesity due to excess calories, vertigo.  Syncope and collapse Based on her symptoms appears to be having vasovagal episodes. However given her the aura of burning sensation she is currently being worked up for seizures by neurology. Hemoglobin within acceptable range. TSH within acceptable range. Orthostatic vital signs negative. ECG unremarkable to explain her symptoms. No identifiable reversible cause. Educated on importance of hydration, avoiding triggers, compression stockings. Echocardiogram will be ordered to evaluate for structural heart disease and left ventricular systolic function. Extended Holter monitor to evaluate for dysrhythmias. Educated on continuing neurology work-up as instructed/recommended. Patient aware of driving restrictions with regards to syncope as instructed by her neurologist.   As part of this initial consultation reviewed outside records available in epic, Care Everywhere which included notes, EKG, labs these findings have been summarized and noted above for further reference.  Discussed disease management, ordering diagnostic testing, coordination of care and patient education provided as a part of today's encounter.  Further recommendations to follow.  Patient and her mom's questions and concerns addressed to her satisfaction.  FINAL MEDICATION LIST END OF ENCOUNTER: No orders of the defined types were placed in this encounter.   There are no discontinued medications.   Current Outpatient Medications:    cetirizine (ZYRTEC) 10 MG tablet, 1 tablet, Disp: , Rfl:    fluticasone (FLONASE) 50 MCG/ACT nasal spray, fluticasone propionate 50 mcg/actuation nasal  spray,suspension, Disp: , Rfl:    meclizine (ANTIVERT) 25 MG tablet, Take 25 mg by mouth 3 (three) times daily as needed., Disp: , Rfl:    Melatonin 10 MG CAPS, Take by mouth. Takes 2 gummys at hs, Disp: , Rfl:    Multiple Vitamin (MULTIVITAMIN) tablet, Take 1 tablet by mouth daily., Disp: , Rfl:   Orders Placed This Encounter  Procedures   EKG 12-Lead    There are no Patient Instructions on file for this visit.   --Continue cardiac medications as reconciled in final medication list. --No follow-ups on file. Or sooner if needed. --Continue follow-up with your primary care physician regarding the management of your other chronic comorbid conditions.  Patient's questions and concerns were addressed to her satisfaction. She voices understanding of the instructions provided during this encounter.   This note was created using a voice recognition software as a result there may be grammatical errors inadvertently enclosed that do  not reflect the nature of this encounter. Every attempt is made to correct such errors.  Rex Kras, [place], [facility]  Pager: [phone] Office: [phone]

## 2021-06-01 ENCOUNTER — Encounter: Payer: Self-pay | Admitting: Cardiology

## 2021-06-01 ENCOUNTER — Encounter: Payer: Self-pay | Admitting: Neurology

## 2021-06-02 NOTE — Telephone Encounter (Signed)
From patient.

## 2021-06-05 ENCOUNTER — Ambulatory Visit
Admission: RE | Admit: 2021-06-05 | Discharge: 2021-06-05 | Disposition: A | Discharge status: Home / Self Care | Payer: BC Managed Care – PPO | Source: Ambulatory Visit | Attending: Neurology | Admitting: Neurology

## 2021-06-05 ENCOUNTER — Telehealth: Payer: Self-pay | Admitting: Neurology

## 2021-06-05 ENCOUNTER — Other Ambulatory Visit: Payer: Self-pay | Admitting: Neurology

## 2021-06-05 ENCOUNTER — Other Ambulatory Visit: Payer: Self-pay

## 2021-06-05 DIAGNOSIS — R55 Syncope and collapse: Secondary | ICD-10-CM

## 2021-06-05 DIAGNOSIS — R4020 Unspecified coma: Secondary | ICD-10-CM

## 2021-06-05 NOTE — Telephone Encounter (Signed)
Patient had an MRI today, was unable to do it. She will need something to calm her down when she does her next MRI.

## 2021-06-05 NOTE — Telephone Encounter (Signed)
Was this an open MRI? Pls order open MRI and we will send in for Valium, thanks

## 2021-06-06 ENCOUNTER — Telehealth: Payer: Self-pay | Admitting: Neurology

## 2021-06-06 MED ORDER — DIAZEPAM 5 MG PO TABS: ORAL_TABLET | ORAL | 0 refills | Status: DC

## 2021-06-06 NOTE — Addendum Note (Signed)
Addended by: Cameron Sprang on: 06/06/2021 01:08 PM   Modules accepted: Orders

## 2021-06-06 NOTE — Telephone Encounter (Signed)
Patient called and said she did not complete her MRI this other day due to claustrophobia.  She has rescheduled the appointment  and is requesting something to be sent in for anxiety to help her complete the visit.  [facility] on [address]

## 2021-06-06 NOTE — Telephone Encounter (Signed)
Pt called no answer left a voice mail per DPR that  informed her that medication to help her relax for MRI was called into the pharmacy

## 2021-06-06 NOTE — Telephone Encounter (Signed)
Rx sent for Valium prior to MRI

## 2021-06-06 NOTE — Telephone Encounter (Signed)
See other phone note

## 2021-06-11 ENCOUNTER — Ambulatory Visit: Payer: BC Managed Care – PPO

## 2021-06-11 ENCOUNTER — Inpatient Hospital Stay: Payer: BC Managed Care – PPO

## 2021-06-11 ENCOUNTER — Other Ambulatory Visit: Payer: Self-pay

## 2021-06-11 DIAGNOSIS — R55 Syncope and collapse: Secondary | ICD-10-CM

## 2021-06-14 ENCOUNTER — Encounter: Payer: Self-pay | Admitting: Neurology

## 2021-06-14 ENCOUNTER — Encounter: Payer: Self-pay | Admitting: Cardiology

## 2021-06-16 NOTE — Telephone Encounter (Signed)
From patient.

## 2021-06-17 ENCOUNTER — Telehealth: Payer: Self-pay

## 2021-06-17 NOTE — Telephone Encounter (Signed)
Patients live monitor was denied, per ST ok to use non live monitor . Patient coming in 06/23/21 for monitor

## 2021-06-19 ENCOUNTER — Ambulatory Visit
Admission: RE | Admit: 2021-06-19 | Discharge: 2021-06-19 | Disposition: A | Discharge status: Home / Self Care | Payer: BC Managed Care – PPO | Source: Ambulatory Visit | Attending: Neurology | Admitting: Neurology

## 2021-06-19 DIAGNOSIS — R4020 Unspecified coma: Secondary | ICD-10-CM

## 2021-06-19 DIAGNOSIS — R55 Syncope and collapse: Secondary | ICD-10-CM

## 2021-06-19 IMAGING — MR MR MRA HEAD W/O CM
13 series · 43 of 48 positions shown · IV contrast (20 ml multihance)
Comparison: Report from head CT [DATE] (images unavailable).

CLINICAL DATA: Syncope, unspecified syncope type. Additional
history provided by scanning technologist: Patient reports 3
syncopal episodes in the last 6 weeks.

EXAM:
MRI HEAD WITHOUT AND WITH CONTRAST
MRA HEAD WITHOUT CONTRAST
TECHNIQUE: Multiplanar, multi-echo pulse sequences of the brain and surrounding
structures were acquired without and with intravenous contrast.
Angiographic images of the Circle of Willis were acquired using MRA
technique without intravenous contrast.
CONTRAST:  20mL MULTIHANCE GADOBENATE DIMEGLUMINE 529 MG/ML IV SOLN

[Series 2: T1 · sagittal · 5.0mm · 0.45mm/px · 2 of 25 slices shown]
[im 1/25]
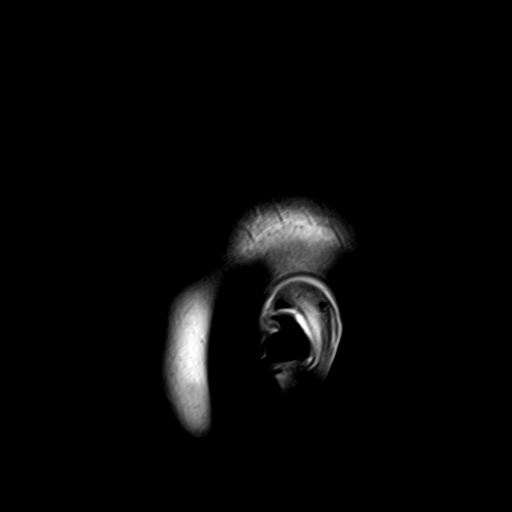
[im 25/25]
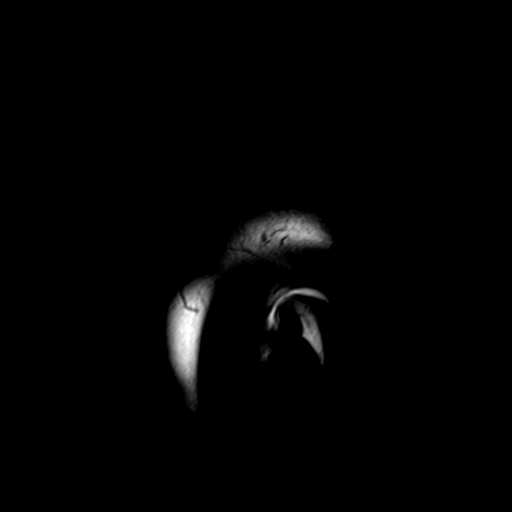

[Series 3: tof_3d_multi-slab new · axial · 0.7mm · 0.35mm/px · z∈[-5,+94]mm · 8 of 143 slices shown]
[im 1/143]
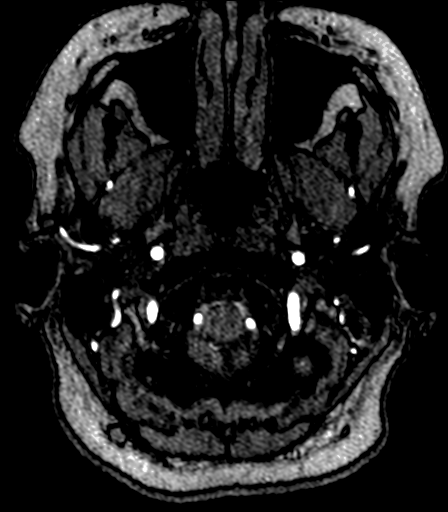
[im 21/143]
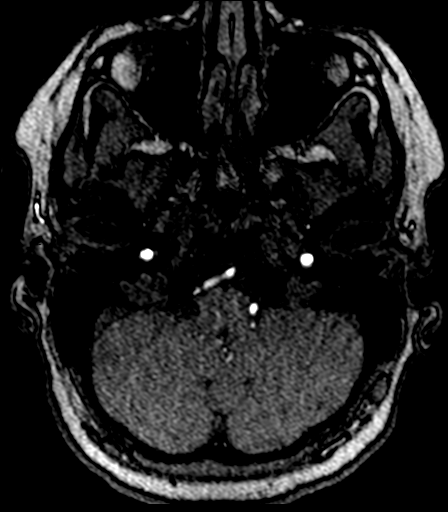
[im 41/143]
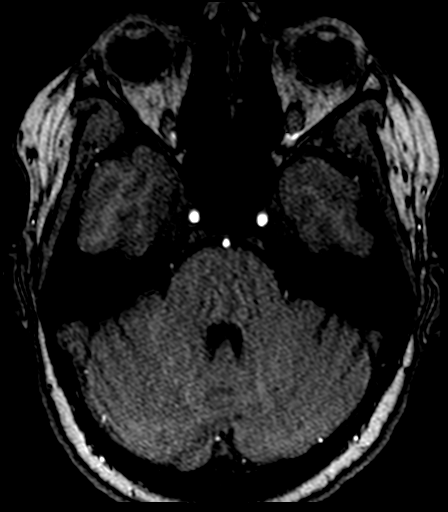
[im 61/143]
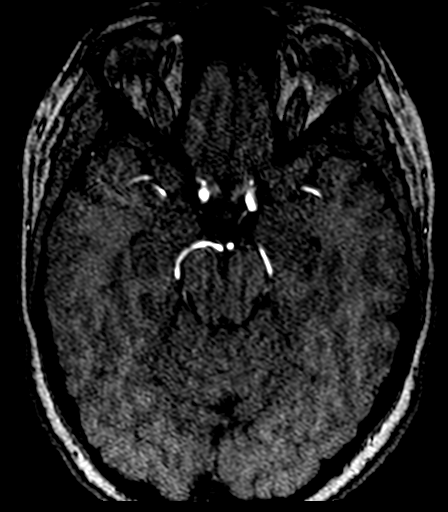
[im 82/143]
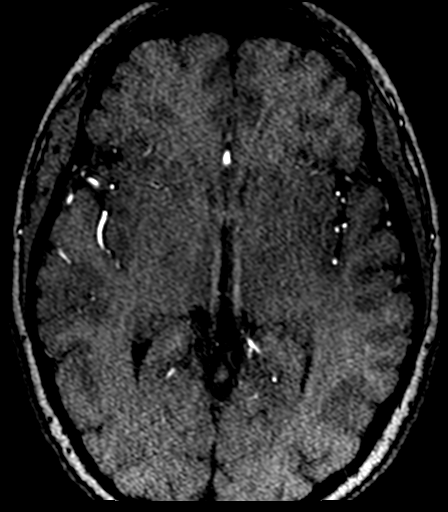
[im 102/143]
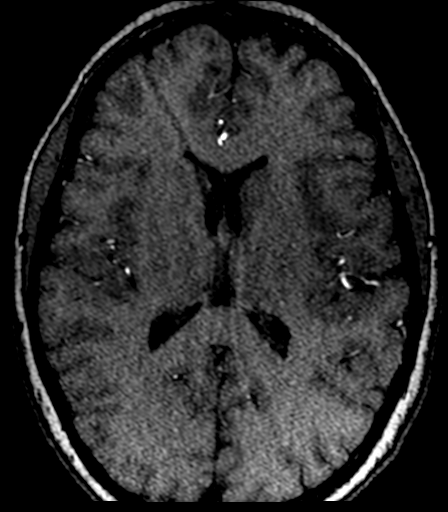
[im 122/143]
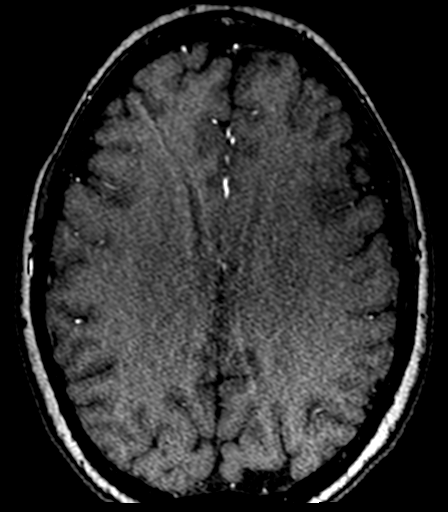
[im 143/143]
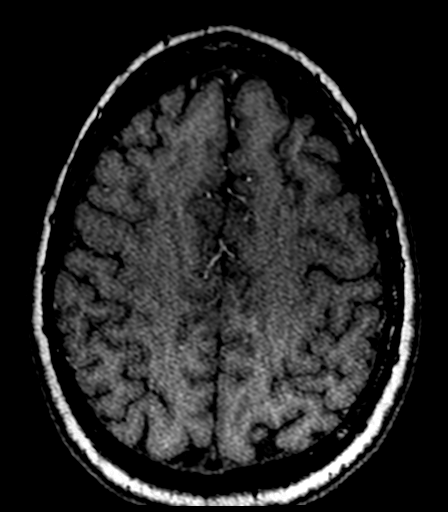

[Series 7: ax ep2d_diff_3 · axial · 3.0mm · 1.80mm/px · z∈[-21,+141]mm · 5 of 108 slices shown]
[im 1/108]
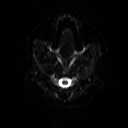
[im 27/108]
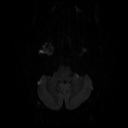
[im 54/108]
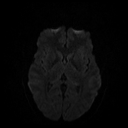
[im 81/108]
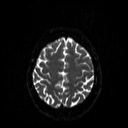
[im 108/108]
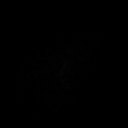

[Series 8: ax ep2d_diff_3_adc · axial · 3.0mm · 1.80mm/px · z∈[-21,+141]mm · 3 of 55 slices shown]
[im 1/55]
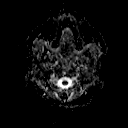
[im 28/55]
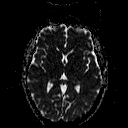
[im 55/55]
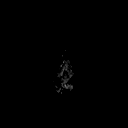

[Series 12: cor ep2d_diff · coronal · 5.0mm · 1.77mm/px · 3 of 60 slices shown]
[im 1/60]
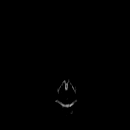
[im 30/60]
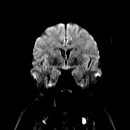
[im 60/60]
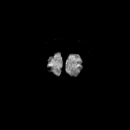

[Series 13: cor ep2d_diff_adc · coronal · 5.0mm · 1.77mm/px · 2 of 30 slices shown]
[im 1/30]
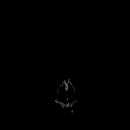
[im 30/30]
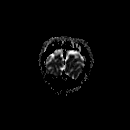

[Series 19: swi_images · axial · 2.0mm · 0.98mm/px · z∈[-19,+139]mm · 4 of 80 slices shown]
[im 1/80]
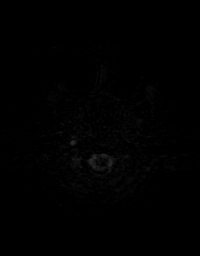
[im 27/80]
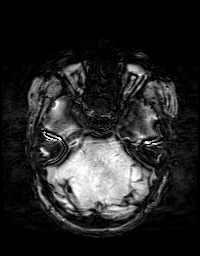
[im 53/80]
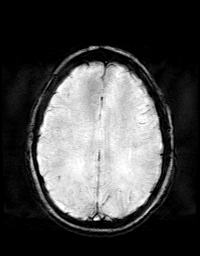
[im 80/80]
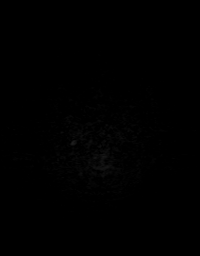

[Series 20: FLAIR · axial · 3.0mm · 0.47mm/px · z∈[-16,+136]mm · 2 of 40 slices shown]
[im 1/40]
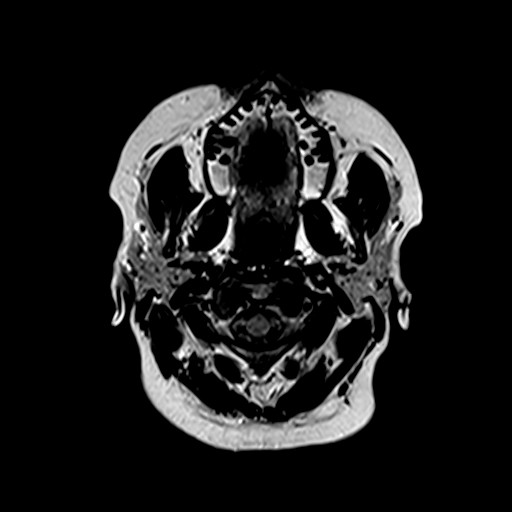
[im 40/40]
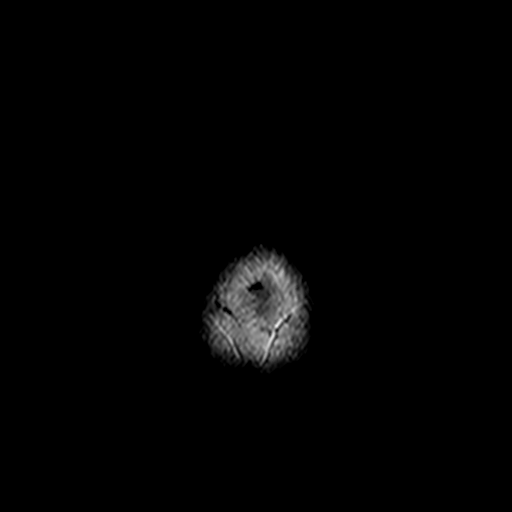

[Series 21: T2 · axial · 5.0mm · 0.65mm/px · 1 of 29 slices shown (1 of 2)]
[im 1/29]
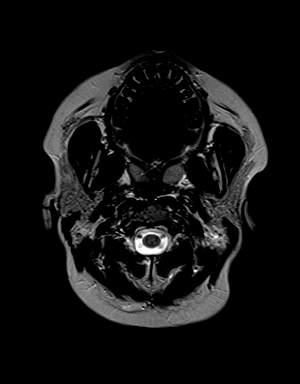

[Series 22: t1_mpr_tra · axial · 1.0mm · 0.72mm/px · z∈[-20,+139]mm · 8 of 160 slices shown]
[im 1/160]
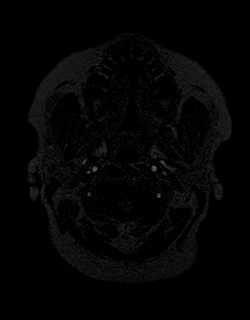
[im 23/160]
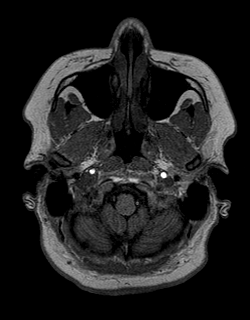
[im 46/160]
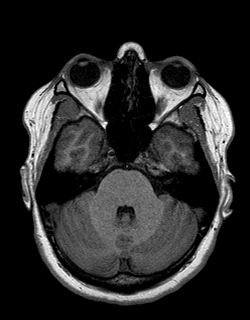
[im 69/160]
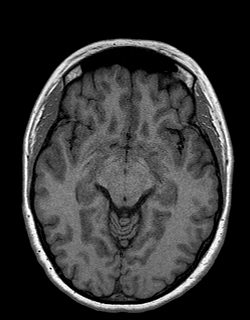
[im 91/160]
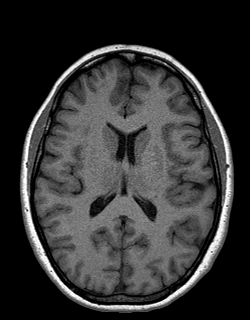
[im 114/160]
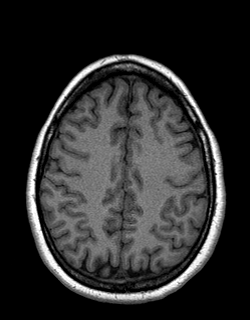
[im 137/160]
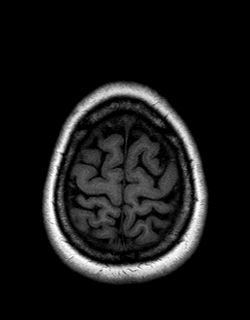
[im 160/160]
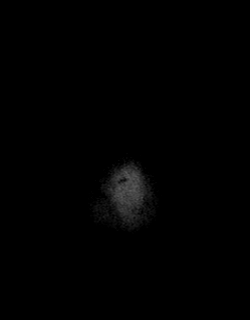

[Series 23: T2 · coronal · 5.0mm · 0.43mm/px · 1 of 28 slices shown (2 of 2)]
[im 1/28]
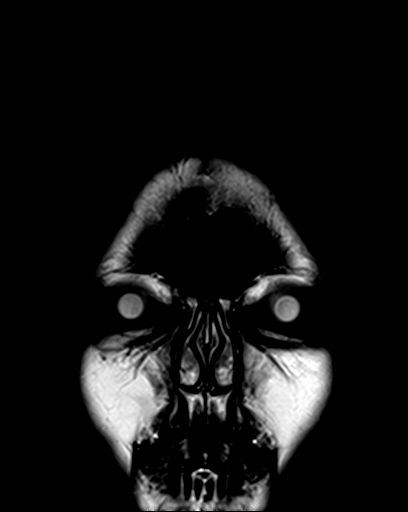

[Series 24: post t1_mpr_tra · axial · 1.0mm · 0.72mm/px · z∈[-20,+25]mm · 3 of 160 slices shown]
[im 1/160]
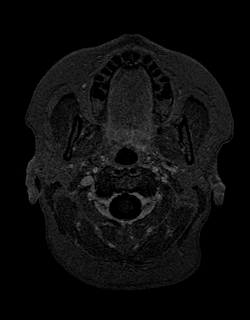
[im 23/160]
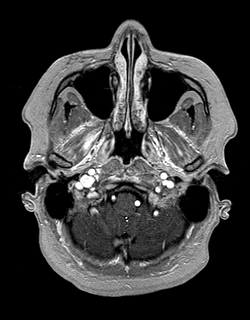
[im 46/160]
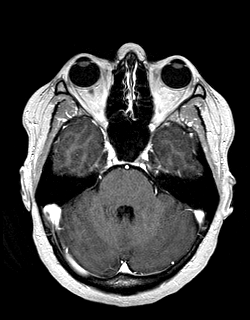

[Series 25: T1 post-contrast · coronal · 5.0mm · 0.72mm/px · 1 of 26 slices shown]
[im 1/26]
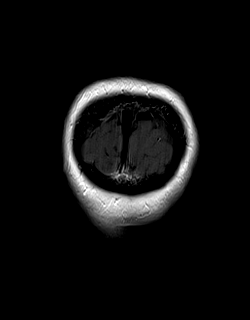

[43 of 48 positions shown; findings below may reference images not displayed]

FINDINGS: MRI HEAD FINDINGS

Brain:

Cerebral volume is normal.

No cortical encephalomalacia is identified. No significant cerebral
white matter disease.

There is no acute infarct.

No evidence of an intracranial mass.

No chronic intracranial blood products.

No extra-axial fluid collection.

No midline shift.

No pathologic intracranial enhancement identified.

Vascular: Maintained flow voids within the proximal large arterial
vessels.

Skull and upper cervical spine: No focal suspicious marrow lesion.

Sinuses/Orbits: Visualized orbits show no acute finding. Trace
mucosal thickening within the bilateral ethmoid sinuses.

Other: Subcentimeter T2 hyperintense focus along the posterior
aspect of the right mandibular condyle, likely reflecting a TMJ
synovial cyst.

MRA HEAD FINDINGS

Anterior circulation:

The intracranial internal carotid arteries are patent. The M1 middle
cerebral arteries are patent. No M2 proximal branch occlusion or
high-grade proximal stenosis is identified. Azygos anterior cerebral
artery. No intracranial aneurysm is identified.

Posterior circulation:

The intracranial vertebral arteries are patent. The basilar artery
is patent. The posterior cerebral arteries are patent. Posterior
communicating arteries are diminutive or absent bilaterally.

Anatomic variants: As described.
IMPRESSION: MRI brain:

Unremarkable MRI appearance of the brain. No evidence of acute
intracranial abnormality.

MRA head:

1. No intracranial large vessel occlusion or proximal high-grade
arterial stenosis.
2. Azygos anterior cerebral artery (anatomic variant).

## 2021-06-19 MED ORDER — GADOBENATE DIMEGLUMINE 529 MG/ML IV SOLN
20.0000 mL | Freq: Once | INTRAVENOUS | Status: AC | PRN
  Administered 2021-06-19: 20 mL via INTRAVENOUS

## 2021-06-20 ENCOUNTER — Inpatient Hospital Stay: Payer: BC Managed Care – PPO

## 2021-06-20 ENCOUNTER — Telehealth: Payer: Self-pay

## 2021-06-20 NOTE — Telephone Encounter (Signed)
-----   Message from Van Clines, MD sent at 06/20/2021 11:10 AM EST ----- Pls let her know the brain MRI and MRA were normal, no tumor, stroke, bleed, or aneurysm seen. Proceed with prolonged EEG as scheduled, thanks

## 2021-06-20 NOTE — Telephone Encounter (Signed)
Pt called no answer left a voice mail per DPR that brain MRI and MRA were normal, no tumor, stroke, bleed, or aneurysm seen. Proceed with prolonged EEG as scheduled, any question she can call the office back at [phone]

## 2021-06-23 ENCOUNTER — Other Ambulatory Visit: Payer: Self-pay

## 2021-06-23 ENCOUNTER — Inpatient Hospital Stay: Payer: BC Managed Care – PPO

## 2021-06-25 ENCOUNTER — Ambulatory Visit (INDEPENDENT_AMBULATORY_CARE_PROVIDER_SITE_OTHER): Payer: BC Managed Care – PPO | Admitting: Neurology

## 2021-06-25 ENCOUNTER — Other Ambulatory Visit: Payer: Self-pay

## 2021-06-25 DIAGNOSIS — R55 Syncope and collapse: Secondary | ICD-10-CM | POA: Diagnosis not present

## 2021-06-26 ENCOUNTER — Encounter: Payer: Self-pay | Admitting: Neurology

## 2021-07-07 NOTE — Procedures (Signed)
ELECTROENCEPHALOGRAM REPORT  Dates of Recording: 06/25/2021 11:33AM to 06/26/2021 11:32AM  Patient's Name: Alexis Yoder MRN: [id number] Date of Birth: 1986-04-24  Referring Provider: Dr. Patrcia Dolly  Procedure: 24-hour ambulatory video EEG  History: This is a 36 year old woman with episodes of loss of consciousness. EEG for classification.  Medications:  none  Technical Summary: This is a 24-hour multichannel digital video EEG recording measured by the international 10-20 system with electrodes applied with paste and impedances below 5000 ohms performed as portable with EKG monitoring.  The digital EEG was referentially recorded, reformatted, and digitally filtered in a variety of bipolar and referential montages for optimal display.    DESCRIPTION OF RECORDING: During maximal wakefulness, the background activity consisted of a symmetric 8.5 Hz posterior dominant rhythm which was reactive to eye opening.  There were no epileptiform discharges or focal slowing seen in wakefulness.  During the recording, the patient progresses through wakefulness, drowsiness, and Stage 2 sleep.  Again, there were no epileptiform discharges seen.  Events: On 2/22 at 1941 hours, she has a headache/ear pain. Patient sitting on couch with no clinical changes seen. Electrographically, there were no EEG or EKG changes seen.  On 2/23 at 1018 hours, she reports dizziness. She is sitting on a chair with no clinical changes seen. Electrographically, there were no EEG or EKG changes seen.   There were no electrographic seizures seen.  EKG lead was unremarkable.  IMPRESSION: This 24-hour ambulatory video EEG study is normal.    CLINICAL CORRELATION: A normal EEG does not exclude a clinical diagnosis of epilepsy. Typical events were not captured. If further clinical questions remain, inpatient video EEG monitoring may be helpful.   Patrcia Dolly, M.D.

## 2021-07-09 ENCOUNTER — Other Ambulatory Visit: Payer: Self-pay

## 2021-07-09 ENCOUNTER — Ambulatory Visit: Payer: BC Managed Care – PPO | Admitting: Neurology

## 2021-07-09 ENCOUNTER — Encounter: Payer: Self-pay | Admitting: Neurology

## 2021-07-09 VITALS — BP 130/88 | HR 66 | Ht 65.0 in | Wt 252.8 lb

## 2021-07-09 DIAGNOSIS — R42 Dizziness and giddiness: Secondary | ICD-10-CM | POA: Diagnosis not present

## 2021-07-09 DIAGNOSIS — R55 Syncope and collapse: Secondary | ICD-10-CM

## 2021-07-09 NOTE — Progress Notes (Signed)
NEUROLOGY FOLLOW UP OFFICE NOTE  Guillermina CityMaggie C Gingras [id number] [birth year]  HISTORY OF PRESENT ILLNESS: I had the pleasure of seeing Threasa AlphaMaggie Demas in follow-up in the neurology clinic on 07/09/2021.  The patient was last seen 2 months ago for episodes of syncope. She is again accompanied by her mother today.  Records and images were personally reviewed where available.  I personally reviewed brain MRI with and without contrast, MRA head, no acute changes, hippocampi symmetric, no flow-limiting stenosis seen. Her 1-hour and 24-hour EEG were normal, typical events were not captured. Episodes of dizziness and headache did not show EEG change. She has kindly been seen by Cardiology and had a normal echocardiogram, her 2-week holter monitor was sent back a few days ago. She contacted our office to report another episode on 05/31/21 where she felt the wave sensation and lost consciousness, then had another wave on 1/27 but did not pass out. No burning smell this time. She had no symptoms for a month until she had an episode in the shower where she felt dizzy and nauseated while she had the heart monitor on last 2/28. Yesterday she was laying down and felt the room spinning, did not feel good like she was going to be sick. She did not have the wave of sensation and did not pass out. She felt it again this morning, they both lasted only a few seconds. Her mother notes she had amenorrhea and was restarted on birth control in January to regulate her periods. Since she started back on birth control, she has not had any syncopal episodes, making her wonder about a hormonal component. She is overall sleeping okay with melatonin. Her mother denies any staring/unresponsive episodes. 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    History on Initial Assessment 05/19/2021: This is a pleasant 36 year old right-handed woman with no significant past medical history, presenting for evaluation of 2 episodes of syncope. She  is accompanied by her mother who helps supplement the history today. She recalls having a syncopal episode in middle school, her mother was braiding her hair by the sink and she fell forward. She recalls that she was getting ready to start her period at that time. She only recalls feeling sleepy, no focal weakness. She had another syncopal episode in late high school/early college with no prior warning, she was told she was anemic and took iron pills for a time. She did well for several years until 04/23/2021. She recalls feeling fine that day, she saw her chiropractor who did adjustments (in her back and neck), then stopped by [employer] for food. She recalls feeling a little nauseated in the car, which is not unusual with history of carsickness. They were visiting her grandmother in [place] and arrived there feeling hungry. She had dinner and then while sitting felt like the room was spinning. She alerted her mother and recalls feeling very warm. Her mother put a wet washcloth on, she felt fine for 30 minutes and started crocheting, when she again felt the same sensation and lost consciousness, waking up to her mother calling her. Her mother reports that she was sitting and then her head went down and her arms flexed on her chest, no convulsive activity. Episode lasted 15-20 seconds. She woke up and felt warm again, vomiting twice en route to the ER. She recalls having a head CT and EKG that were reportedly normal, diagnosed with vertigo and discharged home on meclizine. Over the next  month, she would have brief episodes of feeling "swimmyheaded" and would take meclizine which seemed to curb sensation. No further spinning sensation until 05/10/2021. She felt fine, she had been up and down steps helping pack the house. At dinner, she ate and was sitting on the recliner when she felt the same spinning sensation. She took meclizine and felt fine for 10-20 minutes. She got up and sat on the kitchen chair then  felt the spinning sensation and again lost consciousness also for only a few seconds. She did not fall off the chair, she was able to lay her head on the back of the chair and felt warm and nauseated when she woke up. Her body felt like she had run a marathon, she slept until the next day. There was a little burning sensation in her chest, no palpitations. She still has a little bit of the swimmy-headed sensation right now when she moves her head back and forth. 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 Her mother denies any staring/unresponsive episodes. She denies any focal numbness/tingling/weakness, myoclonic jerks. She has occasional tension headaches. She denies any neck pain. She usually gets 6-7 hours of sleep, melatonin helps. She denies any sleep deprivation or alcohol prior to the events. Memory is really good. 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 Her grandmother (who was my patient) had seizures. She had a normal birth and early development.  There is no history of febrile convulsions, CNS infections such as meningitis/encephalitis, significant traumatic brain injury, neurosurgical procedures.   PAST MEDICAL HISTORY: Past Medical History:  Diagnosis Date   Anemia    Kidney stone    Vertigo     MEDICATIONS: Current Outpatient Medications on File Prior to Visit  Medication Sig Dispense Refill   cetirizine (ZYRTEC) 10 MG tablet 1 tablet     diazepam (VALIUM) 5 MG tablet Take 1 tablet 30 minutes prior to MRI, may take second dose if needed. 2 tablet 0   fluticasone (FLONASE) 50 MCG/ACT nasal spray fluticasone propionate 50 mcg/actuation nasal spray,suspension     meclizine (ANTIVERT) 25 MG tablet Take 25 mg by mouth 3 (three) times daily as needed.     Melatonin 10 MG  CAPS Take by mouth. Takes 2 gummys at hs     Multiple Vitamin (MULTIVITAMIN) tablet Take 1 tablet by mouth daily.     norethindrone-ethinyl estradiol-FE (BLISOVI FE 1/20) 1-20 MG-MCG tablet Take 1 tablet by mouth daily.     No current facility-administered medications on file prior to visit.    ALLERGIES: Allergies  Allergen Reactions   Nickel Rash and Other (See Comments)    FAMILY HISTORY: Family History  Problem Relation Age of Onset   Breast cancer Mother    Diabetes Father     SOCIAL HISTORY: Social History   Socioeconomic History   Marital status: Single    Spouse name: Not on file   Number of children: 0   Years of education: Not on file   Highest education level: Not on file  Occupational History   Occupation: teacher special education  Tobacco Use   Smoking status: Never   Smokeless tobacco: Not on file  Vaping Use   Vaping Use: Never used  Substance and Sexual Activity   Alcohol  use: Yes    Comment: Weekly   Drug use: No   Sexual activity: Not on file  Other Topics Concern   Not on file  Social History Narrative   Right handed   Drinks caffeine   One story home   Social Determinants of Health   Financial Resource Strain: Not on file  Food Insecurity: Not on file  Transportation Needs: Not on file  Physical Activity: Not on file  Stress: Not on file  Social Connections: Not on file  Intimate Partner Violence: Not on file     PHYSICAL EXAM: Vitals:   07/09/21 0957  BP: 130/88  Pulse: 66  SpO2: 98%   General: No acute distress Head:  Normocephalic/atraumatic Skin/Extremities: No rash, no edema Neurological Exam: alert and awake. No aphasia or dysarthria. Fund of knowledge is appropriate.  Attention and concentration are normal.   Cranial nerves: Pupils equal, round. Extraocular movements intact with no nystagmus. Visual fields full.  No facial asymmetry.  Motor: Bulk and tone normal, muscle strength 5/5 throughout with no pronator drift.    Finger to nose testing intact.  Gait narrow-based and steady, able to tandem walk adequately.  Romberg negative.   IMPRESSION: This is a pleasant 36 yo RH woman with recurrent syncopal episodes of unclear etiology. She reports smelling something burning and having a wave of spinning sensation prior to the episodes. Last syncopal episode was 05/31/21, she has had brief spinning episodes this week. We discussed doing vestibular therapy. Will follow-up on holter monitor results, she had a mild episode at that time. If no improvement in episodes of spinning, we discussed doing a therapeutic trial with seizure medication Lamotrigine, and monitor symptoms on medication. Side effects, including Levonne Spiller syndrome, were discussed. She is aware of [place] driving laws to stop driving after after an episode of loss of consciousness until 6 months event-free. Follow-up in 3 months, call for any changes.   Thank you for allowing me to participate in her care.  Please do not hesitate to call for any questions or concerns.    Patrcia Dolly, M.D.   CC: Dr. Chanetta Marshall, Dr. Odis Hollingshead

## 2021-07-09 NOTE — Patient Instructions (Signed)
Good to see you. ? ?Referral will be sent for Vestibular therapy ? ?2. After their evaluation, send me a message on MyChart and we will plan to start Lamotrigine (Lamictal) 25mg : take 1 tablet twice a day for 2 weeks, then increase to 2 tablets twice a day ? ?3. Continue to keep a calendar of your symptoms ? ?4. Follow-up in 3 months, call for any changes ?

## 2021-07-10 ENCOUNTER — Ambulatory Visit: Payer: BC Managed Care – PPO | Admitting: Cardiology

## 2021-07-12 ENCOUNTER — Encounter: Payer: Self-pay | Admitting: Neurology

## 2021-07-15 ENCOUNTER — Ambulatory Visit: Payer: BC Managed Care – PPO | Admitting: Cardiology

## 2021-07-21 ENCOUNTER — Other Ambulatory Visit: Payer: Self-pay

## 2021-07-21 ENCOUNTER — Ambulatory Visit: Payer: BC Managed Care – PPO | Attending: Neurology | Admitting: Physical Therapy

## 2021-07-21 ENCOUNTER — Encounter: Payer: Self-pay | Admitting: Cardiology

## 2021-07-21 ENCOUNTER — Ambulatory Visit: Payer: BC Managed Care – PPO | Admitting: Cardiology

## 2021-07-21 ENCOUNTER — Encounter: Payer: Self-pay | Admitting: Physical Therapy

## 2021-07-21 VITALS — BP 116/76 | HR 68 | Temp 97.8°F | Resp 16 | Ht 65.0 in | Wt 255.8 lb

## 2021-07-21 DIAGNOSIS — H8112 Benign paroxysmal vertigo, left ear: Secondary | ICD-10-CM | POA: Insufficient documentation

## 2021-07-21 DIAGNOSIS — R42 Dizziness and giddiness: Secondary | ICD-10-CM

## 2021-07-21 DIAGNOSIS — I455 Other specified heart block: Secondary | ICD-10-CM

## 2021-07-21 DIAGNOSIS — R55 Syncope and collapse: Secondary | ICD-10-CM | POA: Diagnosis not present

## 2021-07-21 DIAGNOSIS — R4020 Unspecified coma: Secondary | ICD-10-CM

## 2021-07-21 NOTE — Patient Instructions (Signed)
Benign Positional Vertigo ?Vertigo is the feeling that you or your surroundings are moving when they are not. Benign positional vertigo is the most common form of vertigo. This is usually a harmless condition (benign). This condition is positional. This means that symptoms are triggered by certain movements and positions. ?This condition can be dangerous if it occurs while you are doing something that could cause harm to yourself or others. This includes activities such as driving or operating machinery. ?What are the causes? ?The inner ear has fluid-filled canals that help your brain sense movement and balance. When the fluid moves, the brain receives messages about your body's position. ?With benign positional vertigo, calcium crystals in the inner ear break free and disturb the inner ear area. This causes your brain to receive confusing messages about your body's position. ?What increases the risk? ?You are more likely to develop this condition if: ?You are a woman. ?You are 50 years of age or older. ?You have recently had a head injury. ?You have an inner ear disease. ?What are the signs or symptoms? ?Symptoms of this condition usually happen when you move your head or your eyes in different directions. Symptoms may start suddenly and usually last for less than a minute. They include: ?Loss of balance and falling. ?Feeling like you are spinning or moving. ?Feeling like your surroundings are spinning or moving. ?Nausea and vomiting. ?Blurred vision. ?Dizziness. ?Involuntary eye movement (nystagmus). ?Symptoms can be mild and cause only minor problems, or they can be severe and interfere with daily life. Episodes of benign positional vertigo may return (recur) over time. Symptoms may also improve over time. ?How is this diagnosed? ?This condition may be diagnosed based on: ?Your medical history. ?A physical exam of the head, neck, and ears. ?Positional tests to check for or stimulate vertigo. You may be asked to  turn your head and change positions, such as going from sitting to lying down. A health care provider will watch for symptoms of vertigo. ?You may be referred to a health care provider who specializes in ear, nose, and throat problems (ENT or otolaryngologist) or a provider who specializes in disorders of the nervous system (neurologist). ?How is this treated? ?This condition may be treated in a session in which your health care provider moves your head in specific positions to help the displaced crystals in your inner ear move. Treatment for this condition may take several sessions. Surgery may be needed in severe cases, but this is rare. ?In some cases, benign positional vertigo may resolve on its own in 2-4 weeks. ?Follow these instructions at home: ?Safety ?Move slowly. Avoid sudden body or head movements or certain positions, as told by your health care provider. ?Avoid driving or operating machinery until your health care provider says it is safe. ?Avoid doing any tasks that would be dangerous to you or others if vertigo occurs. ?If you have trouble walking or keeping your balance, try using a cane for stability. If you feel dizzy or unstable, sit down right away. ?Return to your normal activities as told by your health care provider. Ask your health care provider what activities are safe for you. ?General instructions ?Take over-the-counter and prescription medicines only as told by your health care provider. ?Drink enough fluid to keep your urine pale yellow. ?Keep all follow-up visits. This is important. ?Contact a health care provider if: ?You have a fever. ?Your condition gets worse or you develop new symptoms. ?Your family or friends notice any behavioral changes. ?You   have nausea or vomiting that gets worse. ?You have numbness or a prickling and tingling sensation. ?Get help right away if you: ?Have difficulty speaking or moving. ?Are always dizzy or faint. ?Develop severe headaches. ?Have weakness in  your legs or arms. ?Have changes in your hearing or vision. ?Develop a stiff neck. ?Develop sensitivity to light. ?These symptoms may represent a serious problem that is an emergency. Do not wait to see if the symptoms will go away. Get medical help right away. Call your local emergency services (911 in the [place] Do not drive yourself to the hospital. ?Summary ?Vertigo is the feeling that you or your surroundings are moving when they are not. Benign positional vertigo is the most common form of vertigo. ?This condition is caused by calcium crystals in the inner ear that become displaced. This causes a disturbance in an area of the inner ear that helps your brain sense movement and balance. ?Symptoms include loss of balance and falling, feeling that you or your surroundings are moving, nausea and vomiting, and blurred vision. ?This condition can be diagnosed based on symptoms, a physical exam, and positional tests. ?Follow safety instructions as told by your health care provider and keep all follow-up visits. This is important. ?This information is not intended to replace advice given to you by your health care provider. Make sure you discuss any questions you have with your health care provider. ?Document Revised: 03/20/2020 Document Reviewed: 03/20/2020 ?Elsevier Patient Education ? 2022 Elsevier Inc. ? ?

## 2021-07-21 NOTE — Progress Notes (Signed)
? ?Date:  07/21/2021  ? ?ID:  Alexis Yoder, DOB 1986-01-27, MRN [id number] ? ?PCP:  Alexis Hale, MD  ?Cardiologist:  Alexis Lerner, DO, [facility] (established care May 30, 2021) ? ?Date: 07/21/21 ?Last Office Visit: 05/30/2021 ? ?Chief Complaint  ?Patient presents with  ? Follow-up  ?  Syncope work-up and review test results  ? ? ?HPI  ?Alexis Yoder is a 36 y.o. Caucasian female whose past medical history and cardiovascular risk factors include: Obesity due to excess calories, Vertigo.  ? ?She is referred to the office at the request of Alexis Yoder, * for evaluation of Syncope. ? ?This patient is accompanied in the office by her  mom . Alexis Yoder provides verbal consent with regards to having her present during today's encounter. ? ?Please refer to the initial consultation for additional details but she presented to the office for evaluation of syncope/loss of consciousness that has occurred 3 times since December 2022 (last episode of syncope May 31, 2021).  At the last office visit the shared decision was to proceed with an echocardiogram and extended Holter monitor to evaluate for dysrhythmias.  Echocardiogram notes preserved LVEF without any significant valvular heart disease or structural abnormalities.  Extended Holter monitor overall on remarkable except 1 patient triggered event noted underlying rhythm to be sinus with symptomatic 3.6-second pause.  ? ?During the triggered event patient states that she was taking a warm shower and all of a sudden felt " warmer" she felt a room spinning, nauseated, sat down on the commode and cooled off and her symptoms resolved.  She did not pass out.  ? ?She has undergone neurology work-up as well with negative EEGs and MRI studies per patient.  She has started vestibular physical therapy which has helped her vertigo symptoms for now. ? ?No recent sick contacts.  No family history of premature CAD/cardiomyopathy/sudden cardiac death. ? ?FUNCTIONAL  STATUS: ?No structured exercise program or daily routine.  ? ?ALLERGIES: ?Allergies  ?Allergen Reactions  ? Nickel Rash and Other (See Comments)  ? ? ?MEDICATION LIST PRIOR TO VISIT: ?Current Meds  ?Medication Sig  ? cetirizine (ZYRTEC) 10 MG tablet 1 tablet  ? fluticasone (FLONASE) 50 MCG/ACT nasal spray fluticasone propionate 50 mcg/actuation nasal spray,suspension  ? meclizine (ANTIVERT) 25 MG tablet Take 25 mg by mouth 3 (three) times daily as needed.  ? Melatonin 10 MG CAPS Take by mouth. Takes 2 gummys at hs  ? Multiple Vitamin (MULTIVITAMIN) tablet Take 1 tablet by mouth daily.  ? norethindrone-ethinyl estradiol-FE (LOESTRIN FE) 1-20 MG-MCG tablet Take 1 tablet by mouth daily.  ?  ? ?PAST MEDICAL HISTORY: ?Past Medical History:  ?Diagnosis Date  ? Anemia   ? Kidney stone   ? Vertigo   ? ? ?PAST SURGICAL HISTORY: ?Past Surgical History:  ?Procedure Laterality Date  ? KNEE SURGERY Left   ? ? ?FAMILY HISTORY: ?The patient family history includes Breast cancer in her mother; Diabetes in her father. ? ?SOCIAL HISTORY:  ?The patient  reports that she has never smoked. She does not have any smokeless tobacco history on file. She reports current alcohol use. She reports that she does not use drugs. ? ?REVIEW OF SYSTEMS: ?Review of Systems  ?Constitutional: Negative for chills and fever.  ?HENT:  Negative for hoarse voice and nosebleeds.   ?Eyes:  Negative for discharge, double vision and pain.  ?Cardiovascular:  Positive for syncope (3 episodes since Dec 2022). Negative for chest pain, claudication, dyspnea on exertion,  leg swelling, near-syncope, orthopnea, palpitations and paroxysmal nocturnal dyspnea.  ?Respiratory:  Negative for hemoptysis and shortness of breath.   ?Musculoskeletal:  Negative for muscle cramps and myalgias.  ?Gastrointestinal:  Negative for abdominal pain, constipation, diarrhea, hematemesis, hematochezia, melena, nausea and vomiting.  ?Neurological:  Positive for vertigo. Negative for dizziness  and light-headedness.  ?All other systems reviewed and are negative. ? ?PHYSICAL EXAM: ?Vitals with BMI 07/21/2021 07/09/2021 05/30/2021  ?Height 5\' 5"  5\' 5"  5\' 5"   ?Weight 255 lbs 13 oz 252 lbs 13 oz 259 lbs 13 oz  ?BMI 42.57 42.07 43.23  ?Systolic 116 130  ?Diastolic 76 88 84  ?Pulse 68 66 75  ? ? ?CONSTITUTIONAL: Well-developed and well-nourished. No acute distress.  ?SKIN: Skin is warm and dry. No rash noted. No cyanosis. No pallor. No jaundice ?HEAD: Normocephalic and atraumatic.  ?EYES: No scleral icterus ?MOUTH/THROAT: Moist oral membranes.  ?NECK: No JVD present. No thyromegaly noted. No carotid bruits  ?LYMPHATIC: No visible cervical adenopathy.  ?CHEST Normal respiratory effort. No intercostal retractions  ?LUNGS: Clear to auscultation bilaterally.  No stridor. No wheezes. No rales.  ?CARDIOVASCULAR: Regular rate and rhythm, positive S1-S2, no murmurs rubs or gallops appreciated. ?ABDOMINAL: Obese, soft, nontender, nondistended, positive bowel sounds all 4 quadrants, no apparent ascites.  ?EXTREMITIES: No peripheral edema, warm to touch, 2+ bilateral DP and PT pulses ?HEMATOLOGIC: No significant bruising ?NEUROLOGIC: Oriented to person, place, and time. Nonfocal. Normal muscle tone.  ?PSYCHIATRIC: Normal mood and affect. Normal behavior. Cooperative ? ?CARDIAC DATABASE: ?EKG: ?05/30/2021: Sinus bradycardia, 52 bpm, without underlying ischemia or injury pattern. ? ?Echocardiogram: ?06/11/2021: ?Left ventricle cavity is normal in size and wall thickness. Normal global wall motion. Normal LV systolic function with EF 56%. Normal diastolic filling pattern. ?No significant valvular abnormality. ?Normal right atrial pressure. ? ?Stress Testing: ?No results found for this or any previous visit from the past 1095 days. ? ? ?Heart Catheterization: ?None ? ?14 day extended Holter monitor: ?Dominant rhythm normal sinus. ?Heart rate 39-151 bpm. Avg HR 68 bpm. ?No atrial fibrillation, supraventricular tachycardia,  ventricular tachycardia, high grade AV block. ?Total ventricular ectopic burden <1%. ?Total supraventricular ectopic burden <1%. ?Patient triggered events: 1. Underlying rhythm sinus with 3.6-second pause, 07/01/2021 at 7:11 AM.  ? ?LABORATORY DATA: ?CBC Latest Ref Rng & Units 10/04/2011  ?WBC 4.0 - 10.5 K/uL 10.8(H)  ?Hemoglobin 12.0 - 15.0 g/dL 08/09/2021  ?Hematocrit 36.0 - 46.0 % 37.8  ?Platelets 150 - 400 K/uL 281  ? ? ?CMP Latest Ref Rng & Units 10/04/2011  ?Glucose 70 - 99 mg/dL 12/04/2011)  ?BUN 6 - 23 mg/dL 8  ?Creatinine 0.50 - 1.10 mg/dL 00.3  ?Sodium 135 - 145 mEq/L 137  ?Potassium 3.5 - 5.1 mEq/L 4.0  ?Chloride 96 - 112 mEq/L 101  ?CO2 19 - 32 mEq/L 25  ?Calcium 8.4 - 10.5 mg/dL 9.3  ? ? ?Lipid Panel  ?No results found for: CHOL, TRIG, HDL, CHOLHDL, VLDL, LDLCALC, LDLDIRECT, LABVLDL ? ?No components found for: NTPROBNP ?No results for input(s): PROBNP in the last 8760 hours. ?No results for input(s): TSH in the last 8760 hours. ? ?BMP ?No results for input(s): NA, K, CL, CO2, GLUCOSE, BUN, CREATININE, CALCIUM, GFRNONAA, GFRAA in the last 8760 hours. ? ?HEMOGLOBIN A1C ?No results found for: HGBA1C, MPG ? ?External Labs: ?Collected: 05/15/2021 available in Care Everywhere. ?TSH 2.62. ?Hemoglobin 13.5 g/dL, hematocrit 491(P. ?BUN 14, creatinine 0.9. ?Sodium 139, potassium 4, chloride 102, bicarb 30. ?AST 26, ALT 41, alkaline phosphatase 95. ? ?  IMPRESSION: ? ?  ICD-10-CM   ?1. Sinus pause  I45.5 Ambulatory referral to Cardiac Electrophysiology  ?  ?2. Loss of consciousness (HCC)  R40.20 Ambulatory referral to Cardiac Electrophysiology  ?  ?3. Vertigo  R42   ?  ?4. Class 3 severe obesity due to excess calories without serious comorbidity with body mass index (BMI) of 40.0 to 44.9 in adult [facility](HCC)  E66.01   ? Z68.41   ?  ?  ? ?RECOMMENDATIONS: ?Guillermina CityMaggie C Yoder is a 36 y.o. Caucasian female whose past medical history and cardiac risk factors include: Obesity due to excess calories, vertigo. ? ?Sinus pause / Loss of consciousness  (HCC) ?Based on symptoms her episodes of loss of consciousness /syncope highly suggestive of vasovagal etiology.  ?Due to recurrence of symptoms she was recommended to undergo an echo and extended Holter monitor.

## 2021-07-21 NOTE — Therapy (Addendum)
?OUTPATIENT PHYSICAL THERAPY VESTIBULAR EVALUATION ? ? ? ? ?Patient Name: Alexis Yoder ?MRN: [id number] ?DOB:30-Jan-1986, 36 y.o., female ?Today's Date: 08/04/2021 ? ?PCP: Shon Haleimberlake, Kathryn S, MD ?REFERRING PROVIDER: Van ClinesAquino, Karen M, MD ? ? ? Outpatient [facility]hab [facility]21/20[facility]  ? 07/21/2021  ? 1030  ?PT Visits / Re-Eval ?  ?Visit Number 1  ?Number of Visits 5  ?Date for PT Re-Evaluation 08/22/2021  ?Authorization ?  ?Authorization Type BCBS  ?Authorization Time Period --  ?Authorization - Visit Number --  ?Authorization - Number of Visits --  ?Progress Note Due on Visit --  ?PT Time Calculation ?  ?PT Start Time 0845  ?PT Stop Time 0932  ?PT Time Calculation (min) 47 min  ?PT - End of Session ?  ?Equipment Utilized During Treatment Gait belt  ?Activity Tolerance Patient tolerated treatment well  ?[facility] assessed/performed  ? ? ?Past Medical History:  ?Diagnosis Date  ? Anemia   ? Kidney stone   ? Vertigo   ? ?Past Surgical History:  ?Procedure Laterality Date  ? KNEE SURGERY Left   ? ?There are no problems to display for this patient. ? ? ?ONSET DATE: 04-23-21 ? ?REFERRING DIAG: R55 (ICD-10-CM) - Syncope, unspecified syncope type R42 (ICD-10-CM) - Vertigo  ? ?THERAPY DIAG:  ?Dizziness and giddiness - Plan: PT plan of care cert/re-cert ? ?BPPV (benign paroxysmal positional vertigo), left - Plan: PT plan of care cert/re-cert ? ?SUBJECTIVE:  ? ?SUBJECTIVE STATEMENT: ?Pt reports last episode of syncope occurred the last week of January; reports she gets flushed, very warm, nausea and feels like a wave is coming over her.  Went to ED in Dec. 2022 - was diagnosed with vertigo.  Second episode occurred in early Jan.; 3rd episode occurred while cleaning out grandparents' house after going up and doing steps; had episode on 07-11-21 with flushed sensation and vertigo but did not pass out.  States she was sitting down. Takes Meclizine if she  feels it coming on - takes it if she has spinning sensation.  States body would be physically worn out after the episode. Has nausea with the vertigo - only had vomiting twice (did with episode in Dec.).   Goes back to cardiologist today because she wore heart monitor for 2 weeks. Had episode in the shower - pushed the button on monitor to indicate occurrence of dizziness. Had bloodwork done - no problems noted.  Blood sugar level was good.  ? ?Pt accompanied by:  father  (waiting in lobby) ? ?PERTINENT HISTORY: Anemia, h/o 3 syncopal episodes since Dec. 2022 ?  ? ? ? ?PAIN:  ?Are you having pain? No ? ?PRECAUTIONS: None ? ?WEIGHT BEARING RESTRICTIONS No ? ?FALLS: Has patient fallen in last 6 months? No, Number of falls: 0 ? ?LIVING ENVIRONMENT: ?Lives with: family ?Lives in: House/apartment ? ?Has following equipment at home: None ? ?PLOF: Independent - Not driving at this time ? ?PATIENT GOALS "Like to see if we can figure out what is going on" ? ?OBJECTIVE:  ? ?DIAGNOSTIC FINDINGS: MRI with and without contrast, MRA head, no acute changes, hippocampi symmetric, no flow-limiting stenosis seen. Her 1-hour and 24-hour EEG were normal, typical events were not captured. Episodes of dizziness and headache did not show EEG change. She has kindly been seen by Cardiology and had a normal echocardiogram  ? ?COGNITION: ?Overall cognitive status: Within functional limits for tasks assessed ?  ?SENSATION: ?WFL ? ? ?POSTURE: No Significant  postural limitations ? ? ?Cervical ROM:  WNL's ? ? ?STRENGTH: WNL's ? ? ?BED MOBILITY:  ?Sit to supine Complete Independence ?Supine to sit Complete Independence ? ?TRANSFERS: ?Sit to stand: Complete Independence ?Stand to sit: Complete Independence ? ? ?GAIT: ?Gait pattern: WFL ?Distance walked: 100 ?Assistive device utilized: None ?Level of assistance: Complete Independence ?Comments: Normal gait ? ? ?PATIENT SURVEYS:  ?FOTO DFS score 61/100; risk adjusted 53/100 ? ? ?VESTIBULAR  ASSESSMENT ? ? GENERAL OBSERVATION: Pt is a 36 yr old female with c/o vertigo, motion sensitivity accompanied by nausea; has had 3 syncopal episodes since Dec. 2022 ?  ? SYMPTOM BEHAVIOR: ?  Subjective history: Pt had syncopal episode on Dec. 21 - went to ED and was diagnosed with vertigo; had another episode in early Jan. And then a 3rd one in late Jan.; pt reports most recent vertiginous episode occurred on 3-10 while riding in car but pt states she did not pass out with this episode - states she did get the flushed feeling.  Has completed wearing heart monitor for 2 weeks - goes to cardiologist this afternoon for the results.  ?  Non-Vestibular symptoms: loss of consciousness syncopal episodes x 3 ?  Type of dizziness: Spinning/Vertigo, Lightheadedness/Faint, and "World moves" ?  Frequency: varies ?  Duration: minutes ?  Aggravating factors: Induced by motion: turning body quickly, sitting in a moving car, and occurred with rolling from Lt side to Rt side in bed a few weeks ago ?  Relieving factors: head stationary and lying supine ?  Progression of symptoms: better ? ? OCULOMOTOR EXAM: ?  Ocular Alignment: normal ?  Ocular ROM: No Limitations ?  Spontaneous Nystagmus: absent ?  Gaze-Induced Nystagmus: absent ?  Smooth Pursuits: intact ?  Saccades: intact ?   ? ?   ? VESTIBULAR - OCULAR REFLEX:  ? ?    Static Visual Acuity line 11:  Dynamic Visual Acuity: line 10 ?  ? POSITIONAL TESTING: Right Dix-Hallpike: none; Duration:none ?Left Dix-Hallpike: upbeating, left nystagmus; Duration: approx. 5 secs ?  ? ?MOTION SENSITIVITY: ? ?  Motion Sensitivity Quotient ? ?Intensity: 0 = none, 1 = Lightheaded, 2 = Mild, 3 = Moderate, 4 = Severe, 5 = Vomiting ? Intensity  ?1. Sitting to supine 0  ?2. Supine to L side nauseous  ?3. Supine to R side nauseous  ?4. Supine to sitting Nauseous - wall moving in circular motion  ?5. L Hallpike-Dix 2  ?6. Up from L  0 but has nausea  ?7. R Hallpike-Dix 0  ?8. Up from R  Less nausea  ?9.  Sitting, head  ?tipped to L knee   ?10. Head up from L  ?knee   ?11. Sitting, head  ?tipped to R knee   ?12. Head up from R  ?knee   ?13. Sitting head turns x5 nausea  ?14.Sitting head nods x5 0  ?15. In stance, 180?  ?turn to L    ?16. In stance, 180?  ?turn to R   ?  ? ? ?VESTIBULAR TREATMENT: ? ?Canalith Repositioning: ?  Epley Left: Number of Reps: 2, Response to Treatment: symptoms improved, and Comment: less "swirling of wall" reported on completion of 2nd rep; less nausea compared to first rep ? ?PATIENT EDUCATION: ?Education details: Etiology of BPPV; gave information on BPPV - article from [facility] and also pt education from [facility] on BPPV; Recommended to pt that she stay well hydrated; keep journal if symptoms occur and jot down any correlations or  characteristics ?Person educated: Patient ?Education method: Explanation and Handouts ?Education comprehension: verbalized understanding ? ? ?GOALS: ?Goals reviewed with patient? Yes ? ?STG's = LTG's ?LONG TERM GOALS: Target date: 09/01/2021 ? ?Pt will have a (-) Lt Dix-Hallpike test with no c/o dizziness, nausea or visual disturbance and no nystagmus to indicate resolution of Lt BPPV. ?Baseline: (+) Lt Dix-Hallpike test ?Goal status: INITIAL ? ?2.  Improve FOTO score from 61/100 to >/= 64/100 to indicate improvement in dizziness. ?Baseline: 61/100 ?Goal status: INITIAL ? ?3.  Independent in HEP for habituation exercises prn; verbalize understanding of Epley maneuver for Lt BPPV for self treatment prn. ?Baseline: Dependent ?Goal status: INITIAL ? ?4.  Complete SOT and set goal as appropriate.  ?Baseline: to be completed ?Goal status: INITIAL ? ? ?ASSESSMENT: ? ?CLINICAL IMPRESSION: ?Patient is a 36 y.o. female who was seen today for physical therapy evaluation and treatment for vertigo. Pt had (+) Lt Dix-Hallpike test with only 2-3 beats of Lt upbeating nystagmus in test position, but pt reported nausea and visual movement with wall moving in circular motion.  Epley  maneuver for Lt BPPV posterior canalithiasis was performed for 2 reps with pt reporting improvement in symptoms on 2nd rep.  Oculomotor testing WNL's and DVA WNL's with only a 1 line difference from SVA.  Pt does appea

## 2021-08-05 ENCOUNTER — Ambulatory Visit: Payer: BC Managed Care – PPO | Attending: Neurology | Admitting: Physical Therapy

## 2021-08-05 DIAGNOSIS — R42 Dizziness and giddiness: Secondary | ICD-10-CM | POA: Diagnosis present

## 2021-08-05 DIAGNOSIS — H8112 Benign paroxysmal vertigo, left ear: Secondary | ICD-10-CM | POA: Diagnosis present

## 2021-08-05 NOTE — Therapy (Signed)
?OUTPATIENT PHYSICAL THERAPY VESTIBULAR TREATMENT NOTE & DISCHARGE SUMMARY ? ? ?Patient Name: Alexis Yoder ?MRN: [id number] ?DOB:Jun 16, 1985, 36 y.o., female ?Today's Date: 08/05/2021 ? ?PCP: Glenis Smoker, MD ?REFERRING PROVIDER: Ellouise Newer, MD ? ? PT End of Session - 08/05/21 2156   ? ? Visit Number 2   ? Number of Visits 5   ? Date for PT Re-Evaluation 08/22/21   ? Authorization Type BCBS   ? PT Start Time 1535   ? PT Stop Time 2023   ? PT Time Calculation (min) 42 min   ? Activity Tolerance Patient tolerated treatment well   ? Behavior During Therapy [facility] for tasks assessed/performed   ? ?  ?  ? ?  ? ? ?Past Medical History:  ?Diagnosis Date  ? Anemia   ? Kidney stone   ? Vertigo   ? ?Past Surgical History:  ?Procedure Laterality Date  ? KNEE SURGERY Left   ? ?There are no problems to display for this patient. ? ? ?ONSET DATE: Dec. 2022 ? ?REFERRING DIAG: R55 (ICD-10-CM) - Syncope, unspecified syncope type R42 (ICD-10-CM) - Vertigo  ? ?THERAPY DIAG:  ?Dizziness and giddiness ? ?BPPV (benign paroxysmal positional vertigo), left ? ?PERTINENT HISTORY: with no significant past medical history, presenting for evaluation of 2 episodes of syncope.  ? ?PRECAUTIONS: None ? ?SUBJECTIVE: Pt reports she has not had any spinning vertigo since last session; saw cardiologist and stated that heart monitor showed her heart stopped for 3 secs during the syncopal episodes; pt states she is now scheduled to see an electrocardiologist  ? ?PAIN:  ?Are you having pain? No ? ? ? ?OBJECTIVE:  ? ?POSITIONAL TESTS: ? ?Right Dix-Hallpike: none; Duration:none ?Left Dix-Hallpike: none; Duration: none   ?Pt reported mild light-headedness with return to upright from Lt Dix-Hallpike test position; slightly less reported from Rt Dix-hallpike test position ? ?VESTIBULAR TREATMENT: ? ?  ?Other: Sensory Organization Test: composite score 68/100 with N= 70/100 ? ?Somatosensory, visual and vestibular inputs are all WNL's ? ?Condition 1 - trials  1 & 3 WNL's with trial 2 slightly decr. ?Condition 2 - all 3 trials WNL's ?Condition 3 - all 3 trials WNL's ?Condition 4 - all 3 trials WNL's ?Condition 5 - trials 1 & 3 WNL's; trial 2 decr. ?Condition 6 - FALL trial 1:  trial 2 WNL's and trial 3 slightly decreased ? ? ?  ?PATIENT EDUCATION: ?Education details: Interpretation of SOT scores explained to pt - scores WNL's ?Education method: Explanation and Handouts ?Education comprehension: verbalized understanding ?  ?  ?GOALS: ?Goals reviewed with patient? Yes ?  ?STG's = LTG's ?LONG TERM GOALS: Target date: 09/01/2021 ?  ?Pt will have a (-) Lt Dix-Hallpike test with no c/o dizziness, nausea or visual disturbance and no nystagmus to indicate resolution of Lt BPPV. ?Baseline: (+) Lt Dix-Hallpike test ?Goal status: Goal met 08-05-21 ?  ?2.  Improve FOTO score from 61/100 to >/= 64/100 to indicate improvement in dizziness. ?Baseline: 61/100; score 67/100 at D/C ?Goal status: Goal met 08-05-21 ?  ?3.  Independent in HEP for habituation exercises prn; verbalize understanding of Epley maneuver for Lt BPPV for self treatment prn. ?Baseline: Dependent ?Goal status: Goal met 08-05-21 ?  ?4.  Complete SOT and set goal as appropriate.  ?Baseline: to be completed ?Goal status: Goal deferred - score WNL's  ?  ?ASSESSMENT: ?  ?CLINICAL IMPRESSION: ?Pt has met 4/4 LTG's; Lt BPPV has resolved as of current time.  No further needs identified.   ? ? ? ?  PHYSICAL THERAPY DISCHARGE SUMMARY ? ?Visits from Start of Care: 2 ? ?Current functional level related to goals / functional outcomes: ?See above for progress towards goals ?  ?Remaining deficits: ?Pt continues to have some motion sensitivity but no c/o vertigo at this time ?  ?Education / Equipment: ?Pt was instructed in Epley maneuver and was provided information on etiology of BPPV.  Vestibular input per SOT is WNL's so no HEP was issued.   ? ?Patient agrees to discharge. Patient goals were met. Patient is being discharged due to meeting  the stated rehab goals.  ? ? ? ? ?EZBMZT, AEWYB RKVTXLE, PT ?08/05/2021, 9:57 PM ? ? ?    ?

## 2021-08-24 ENCOUNTER — Encounter: Payer: Self-pay | Admitting: Neurology

## 2021-08-24 ENCOUNTER — Encounter: Payer: Self-pay | Admitting: Cardiology

## 2021-08-25 ENCOUNTER — Other Ambulatory Visit: Payer: Self-pay

## 2021-08-25 ENCOUNTER — Emergency Department (HOSPITAL_BASED_OUTPATIENT_CLINIC_OR_DEPARTMENT_OTHER): Payer: BC Managed Care – PPO | Admitting: Radiology

## 2021-08-25 ENCOUNTER — Encounter (HOSPITAL_BASED_OUTPATIENT_CLINIC_OR_DEPARTMENT_OTHER): Payer: Self-pay

## 2021-08-25 ENCOUNTER — Emergency Department (HOSPITAL_BASED_OUTPATIENT_CLINIC_OR_DEPARTMENT_OTHER)
Admission: EM | Admit: 2021-08-25 | Discharge: 2021-08-25 | Disposition: A | Discharge status: Home / Self Care | Payer: BC Managed Care – PPO | Attending: Emergency Medicine | Admitting: Emergency Medicine

## 2021-08-25 DIAGNOSIS — S60221A Contusion of right hand, initial encounter: Secondary | ICD-10-CM | POA: Insufficient documentation

## 2021-08-25 DIAGNOSIS — R55 Syncope and collapse: Secondary | ICD-10-CM | POA: Insufficient documentation

## 2021-08-25 DIAGNOSIS — X58XXXA Exposure to other specified factors, initial encounter: Secondary | ICD-10-CM | POA: Insufficient documentation

## 2021-08-25 DIAGNOSIS — R110 Nausea: Secondary | ICD-10-CM | POA: Insufficient documentation

## 2021-08-25 DIAGNOSIS — S6991XA Unspecified injury of right wrist, hand and finger(s), initial encounter: Secondary | ICD-10-CM | POA: Diagnosis present

## 2021-08-25 LAB — CBC
HCT: 40.5 % (ref 36.0–46.0)
Hemoglobin: 13.4 g/dL (ref 12.0–15.0)
MCH: 28.3 pg (ref 26.0–34.0)
MCHC: 33.1 g/dL (ref 30.0–36.0)
MCV: 85.4 fL (ref 80.0–100.0)
Platelets: 354 10*3/uL (ref 150–400)
RBC: 4.74 MIL/uL (ref 3.87–5.11)
RDW: 12.9 % (ref 11.5–15.5)
WBC: 9.9 10*3/uL (ref 4.0–10.5)
nRBC: 0 % (ref 0.0–0.2)

## 2021-08-25 LAB — BASIC METABOLIC PANEL
Anion gap: 9 (ref 5–15)
BUN: 10 mg/dL (ref 6–20)
CO2: 25 mmol/L (ref 22–32)
Calcium: 9.5 mg/dL (ref 8.9–10.3)
Chloride: 104 mmol/L (ref 98–111)
Creatinine, Ser: 0.72 mg/dL (ref 0.44–1.00)
GFR, Estimated: >60 mL/min (ref >60)
Glucose, Bld: 100 mg/dL — ABNORMAL HIGH (ref 70–99)
Potassium: 3.8 mmol/L (ref 3.5–5.1)
Sodium: 138 mmol/L (ref 135–145)

## 2021-08-25 LAB — URINALYSIS, ROUTINE W REFLEX MICROSCOPIC
Bilirubin Urine: NEGATIVE
Glucose, UA: NEGATIVE mg/dL
Hgb urine dipstick: NEGATIVE
Ketones, ur: NEGATIVE mg/dL
Leukocytes,Ua: NEGATIVE
Nitrite: NEGATIVE
Protein, ur: NEGATIVE mg/dL
Specific Gravity, Urine: 1.024 (ref 1.005–1.030)
pH: 7.5 (ref 5.0–8.0)

## 2021-08-25 LAB — PREGNANCY, URINE: Preg Test, Ur: NEGATIVE

## 2021-08-25 LAB — CBG MONITORING, ED: Glucose-Capillary: 94 mg/dL (ref 70–99)

## 2021-08-25 NOTE — Telephone Encounter (Signed)
From patient.

## 2021-08-25 NOTE — Discharge Instructions (Signed)
As we discussed, your work-up in the ER was reassuring for acute abnormalities.  I recommend following up with your electrophysiologist at your appointment in May for further evaluation and management of this. ? ?Return if development of any new or worsening symptoms. ?

## 2021-08-25 NOTE — ED Triage Notes (Signed)
Patient here POV from Home with Syncope. ? ?Endorses a Syncopal Episode in December and 2 Syncopal Episodes in January. Patient has been seen by [facility] for Same. ? ?Patient endorses two Syncopal Episodes at Home, Once Yesterday AM and another this AM. ? ?Endorses Pain to Right Hand and Forehead. ? ?No Fevers. No N/V/D.  ? ?NAD Noted during Triage. A&Ox4. GCS 15. Ambulatory. ?

## 2021-08-25 NOTE — ED Provider Notes (Signed)
?[facility] EM[facility] DEPT ?Provider Note ? ? ?CSN: [id number] ?Arrival date & time: 08/25/21  1120 ? ?  ? ?History ? ?Chief Complaint  ?Patient presents with  ? Loss of Consciousness  ? ? ?Alexis Yoder is a 36 y.o. female. ? ?Patient with significant history of syncopal episodes presents today with complaints of syncopal episode.  She states that yesterday morning when she was using the bathroom she suddenly began to get the feeling of 'waves' and the feeling of warmth with some mild nausea without vomiting and subsequently stood up from the toilet and had a syncopal episode. She states that she was for only a few seconds and then woke up.  She states that she struck her forehead on the linoleum bathroom floor and got her right hand caught underneath her.  Following the event, she was able to get off the ground and walk around normally.  She states that she has a small lump on her forehead but denies any head or neck pain.  Additionally, around 930 this morning she was sitting in a chair and suddenly again had the same feeling of 'waves' and got really warm and nauseous and proceeded to have a subsequent syncopal episode which she states she feels only lasted for 1 second and she did not fall out of the chair.  She states that immediately following the event she felt very tired but otherwise back to normal.  She denies any fevers, chills, chest pain, shortness of breath, nausea, vomiting, diarrhea, abdominal pain. ? ?Of note, upon chart review and discussion with patient it appears that she has a significant history of syncopal episodes dating back to 04/2021.  She has subsequently been seen by neurology and cardiology with several MRIs of her brain and EEGs with neurology and stress test, echocardiogram, and Holter monitor evaluation with cardiology.  All of these have been completely unremarkable except for 1 to 3-second sinus pause during a syncopal episode seen on a Holter monitor in January.  She  has subsequently been wearing a Fitbit that monitors her heart rate constantly and is able to backtrack.  She states that when she had the syncopal episode yesterday she unfortunately was not wearing the fit bit as it was charging, however today she was wearing it and states that her heart rate went from 120 to 80 bpm.  Patient has been referred to electrophysiology and has an appointment on 5/11 for further evaluation and management of her syncopal episodes. ? ?Of note, patient states that the main reason she presented here today was due to the pain in her right hand and concern for injury related to this. ? ?The history is provided by the patient. No language interpreter was used.  ?Loss of Consciousness ?Associated symptoms: no dizziness, no fever, no headaches, no seizures and no weakness   ? ?  ? ?Home Medications ?Prior to Admission medications   ?Medication Sig Start Date End Date Taking? Authorizing Provider  ?cetirizine (ZYRTEC) 10 MG tablet 1 tablet    [provider]  ?fluticasone (FLONASE) 50 MCG/ACT nasal spray fluticasone propionate 50 mcg/actuation nasal spray,suspension 05/14/14   [provider]  ?meclizine (ANTIVERT) 25 MG tablet Take 25 mg by mouth 3 (three) times daily as needed. 04/25/21   [provider]  ?Melatonin 10 MG CAPS Take by mouth. Takes 2 gummys at hs    [provider]  ?Multiple Vitamin (MULTIVITAMIN) tablet Take 1 tablet by mouth daily.    [provider]  ?  norethindrone-ethinyl estradiol-FE (LOESTRIN FE) 1-20 MG-MCG tablet Take 1 tablet by mouth daily.    [provider]  ?   ? ?Allergies    ?Nickel   ? ?Review of Systems   ?Review of Systems  ?Constitutional:  Negative for chills and fever.  ?Cardiovascular:  Positive for syncope.  ?Musculoskeletal:  Positive for arthralgias.  ?Neurological:  Positive for syncope. Negative for dizziness, tremors, seizures, facial asymmetry, speech difficulty, weakness, light-headedness,  numbness and headaches.  ?All other systems reviewed and are negative. ? ?Physical Exam ?Updated Vital Signs ?BP (!) 150/107 (BP Location: Right Arm)   Pulse 68   Temp 97.7 ?F (36.5 ?C) (Oral)   Resp 18   Ht  (1.651 m)   Wt 116 kg   SpO2 100%   BMI 42.56 kg/m?  ?Physical Exam ?Vitals and nursing note reviewed.  ?Constitutional:   ?   General: She is not in acute distress. ?   Appearance: Normal appearance. She is normal weight. She is not ill-appearing, toxic-appearing or diaphoretic.  ?HENT:  ?   Head: Normocephalic and atraumatic.  ?   Comments: Extremely small hematoma noted at the hairline on the right side not visible but felt on palpation.  No bruising or crepitus. ?Eyes:  ?   Extraocular Movements: Extraocular movements intact.  ?   Pupils: Pupils are equal, round, and reactive to light.  ?Cardiovascular:  ?   Rate and Rhythm: Normal rate and regular rhythm.  ?   Pulses: Normal pulses.  ?   Heart sounds: Normal heart sounds.  ?Pulmonary:  ?   Effort: Pulmonary effort is normal. No respiratory distress.  ?   Breath sounds: Normal breath sounds.  ?Abdominal:  ?   General: Abdomen is flat.  ?   Palpations: Abdomen is soft.  ?Musculoskeletal:     ?   General: Normal range of motion.  ?   Cervical back: Normal range of motion.  ?   Comments: Mild bruising noted to the back of the right hand over the metacarpals.  Distal sensation intact, capillary refill less than 2 seconds.  Full ROM intact to the hand and wrist.  ?Skin: ?   General: Skin is warm and dry.  ?Neurological:  ?   General: No focal deficit present.  ?   Mental Status: She is alert and oriented to person, place, and time.  ?   GCS: GCS eye subscore is 4. GCS verbal subscore is 5. GCS motor subscore is 6.  ?   Sensory: Sensation is intact.  ?   Motor: Motor function is intact.  ?   Coordination: Coordination is intact.  ?   Gait: Gait is intact.  ?   Comments: Alert and oriented to self, place, time and event.  ?  ?Speech is fluent, clear  without dysarthria or dysphasia.  ?  ?Strength 5/5 in upper/lower extremities   ?Sensation intact in upper/lower extremities  ?  ?CN I not tested  ?CN II grossly intact visual fields bilaterally. Did not visualize posterior eye.  ?CN III, IV, VI PERRLA and EOMs intact bilaterally  ?CN V Intact sensation to sharp and light touch to the face  ?CN VII facial movements symmetric  ?CN VIII not tested  ?CN IX, X no uvula deviation, symmetric rise of soft palate  ?CN XI 5/5 SCM and trapezius strength bilaterally  ?CN XII Midline tongue protrusion, symmetric L/R movements   ?Psychiatric:     ?   Mood and Affect: Mood  normal.     ?   Behavior: Behavior normal.  ? ? ?ED Results / Procedures / Treatments   ?Labs ?(all labs ordered are listed, but only abnormal results are displayed) ?Labs Reviewed  ?BASIC METABOLIC PANEL - Abnormal; Notable for the following components:  ?    Result Value  ? Glucose, Bld 100 (*)   ? All other components within normal limits  ?CBC  ?URINALYSIS, ROUTINE W REFLEX MICROSCOPIC  ?PREGNANCY, URINE  ?CBG MONITORING, ED  ? ? ?EKG ?EKG Interpretation ? ?Date/Time:  Monday August 25 2021 11:32:29 EDT ?Ventricular Rate:  59 ?PR Interval:  146 ?QRS Duration: 96 ?QT Interval:  400 ?QTC Calculation: 396 ?R Axis:   8 ?Text Interpretation: Sinus bradycardia with sinus arrhythmia Otherwise normal ECG When compared with ECG of 04-Oct-2011 14:06, No significant change was found Confirmed by Alvester Chou [phone]) on 08/25/2021 11:50:37 AM ? ?Radiology ?DG Hand Complete Right ? ?Result Date: 08/25/2021 ?CLINICAL DATA:  Syncope with fall, right hand pain EXAM: RIGHT HAND - COMPLETE 3+ VIEW COMPARISON:  None. FINDINGS: There is no evidence of fracture or dislocation. There is no evidence of arthropathy or other focal bone abnormality. Soft tissues are unremarkable. IMPRESSION: Negative. Electronically Signed   By: Guadlupe Spanish M.D.   On: 08/25/2021 11:58   ? ?Procedures ?Procedures  ? ? ?Medications Ordered in  ED ?Medications - No data to display ? ?ED Course/ Medical Decision Making/ A&P ?  ?                        ?Medical Decision Making ?Amount and/or Complexity of Data Reviewed ?Labs: ordered. ?Radiology: ordered. ? ? ?Lowella Dandy

## 2021-09-02 ENCOUNTER — Emergency Department (HOSPITAL_BASED_OUTPATIENT_CLINIC_OR_DEPARTMENT_OTHER)
Admission: EM | Admit: 2021-09-02 | Discharge: 2021-09-02 | Disposition: A | Discharge status: Home / Self Care | Payer: BC Managed Care – PPO | Attending: Emergency Medicine | Admitting: Emergency Medicine

## 2021-09-02 ENCOUNTER — Telehealth: Payer: Self-pay

## 2021-09-02 ENCOUNTER — Other Ambulatory Visit: Payer: Self-pay

## 2021-09-02 ENCOUNTER — Encounter (HOSPITAL_BASED_OUTPATIENT_CLINIC_OR_DEPARTMENT_OTHER): Payer: Self-pay | Admitting: Emergency Medicine

## 2021-09-02 ENCOUNTER — Emergency Department (HOSPITAL_BASED_OUTPATIENT_CLINIC_OR_DEPARTMENT_OTHER): Payer: BC Managed Care – PPO

## 2021-09-02 ENCOUNTER — Encounter: Payer: Self-pay | Admitting: Neurology

## 2021-09-02 ENCOUNTER — Emergency Department (HOSPITAL_BASED_OUTPATIENT_CLINIC_OR_DEPARTMENT_OTHER): Payer: BC Managed Care – PPO | Admitting: Radiology

## 2021-09-02 ENCOUNTER — Encounter: Payer: Self-pay | Admitting: Cardiology

## 2021-09-02 DIAGNOSIS — S0003XA Contusion of scalp, initial encounter: Secondary | ICD-10-CM | POA: Insufficient documentation

## 2021-09-02 DIAGNOSIS — R55 Syncope and collapse: Secondary | ICD-10-CM | POA: Insufficient documentation

## 2021-09-02 DIAGNOSIS — Y990 Civilian activity done for income or pay: Secondary | ICD-10-CM | POA: Insufficient documentation

## 2021-09-02 DIAGNOSIS — M25561 Pain in right knee: Secondary | ICD-10-CM | POA: Insufficient documentation

## 2021-09-02 DIAGNOSIS — W07XXXA Fall from chair, initial encounter: Secondary | ICD-10-CM | POA: Diagnosis not present

## 2021-09-02 DIAGNOSIS — S0990XA Unspecified injury of head, initial encounter: Secondary | ICD-10-CM | POA: Diagnosis present

## 2021-09-02 LAB — BASIC METABOLIC PANEL
Anion gap: 11 (ref 5–15)
BUN: 13 mg/dL (ref 6–20)
CO2: 22 mmol/L (ref 22–32)
Calcium: 8.9 mg/dL (ref 8.9–10.3)
Chloride: 101 mmol/L (ref 98–111)
Creatinine, Ser: 0.61 mg/dL (ref 0.44–1.00)
GFR, Estimated: >60 mL/min (ref >60)
Glucose, Bld: 113 mg/dL — ABNORMAL HIGH (ref 70–99)
Potassium: 4 mmol/L (ref 3.5–5.1)
Sodium: 134 mmol/L — ABNORMAL LOW (ref 135–145)

## 2021-09-02 LAB — CBC
HCT: 39.4 % (ref 36.0–46.0)
Hemoglobin: 13.4 g/dL (ref 12.0–15.0)
MCH: 28.8 pg (ref 26.0–34.0)
MCHC: 34 g/dL (ref 30.0–36.0)
MCV: 84.5 fL (ref 80.0–100.0)
Platelets: 361 10*3/uL (ref 150–400)
RBC: 4.66 MIL/uL (ref 3.87–5.11)
RDW: 12.9 % (ref 11.5–15.5)
WBC: 13.6 10*3/uL — ABNORMAL HIGH (ref 4.0–10.5)
nRBC: 0 % (ref 0.0–0.2)

## 2021-09-02 LAB — CBG MONITORING, ED: Glucose-Capillary: 116 mg/dL — ABNORMAL HIGH (ref 70–99)

## 2021-09-02 LAB — TROPONIN I (HIGH SENSITIVITY): Troponin I (High Sensitivity): 3 ng/L (ref <18)

## 2021-09-02 LAB — HCG, SERUM, QUALITATIVE: Preg, Serum: NEGATIVE

## 2021-09-02 IMAGING — CT CT HEAD W/O CM
4 series · 17 of 47 positions shown, 19 images · non-contrast
Comparison: Brain MRI [DATE]

CLINICAL DATA: Headache, syncope, patient hit back of head



[Series 2: head wo · axial · 0.40mm/px · z∈[+849,+964]mm · 7 of 31 slices shown, 9 images]
[im 4/31  brain]
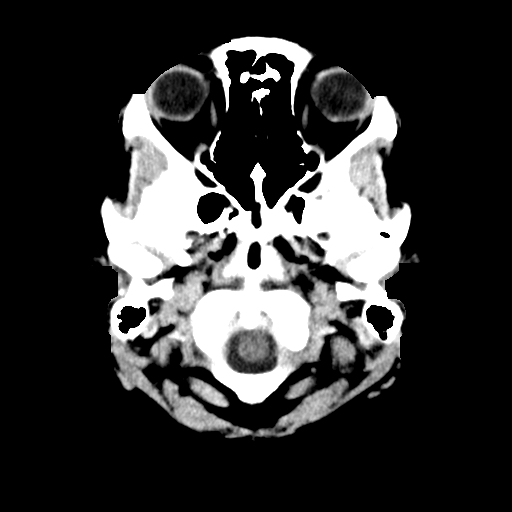
[im 4/31  bone]
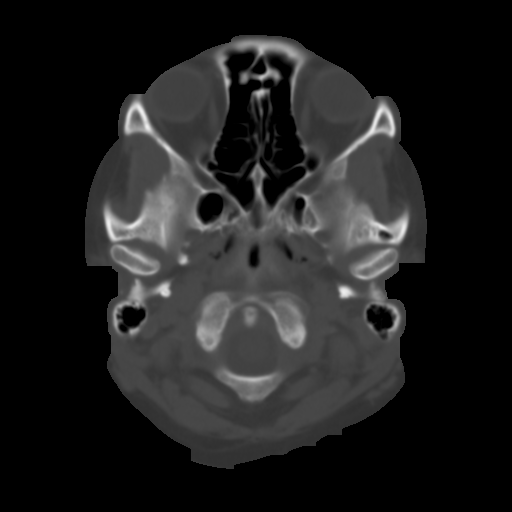
[im 8/31  brain]
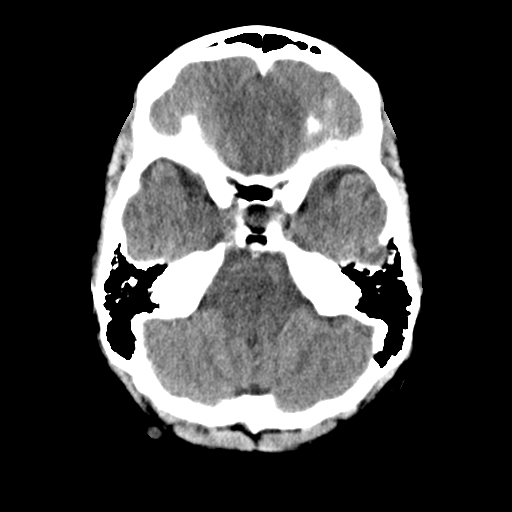
[im 12/31  brain]
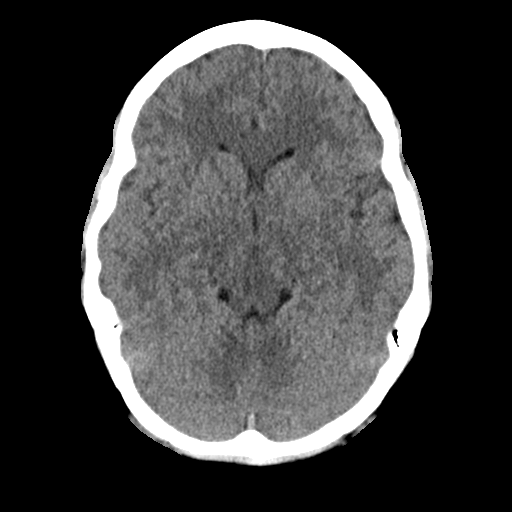
[im 16/31  brain]
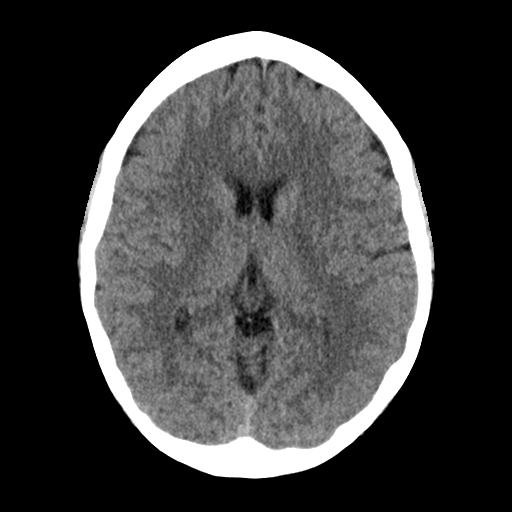
[im 19/31  brain]
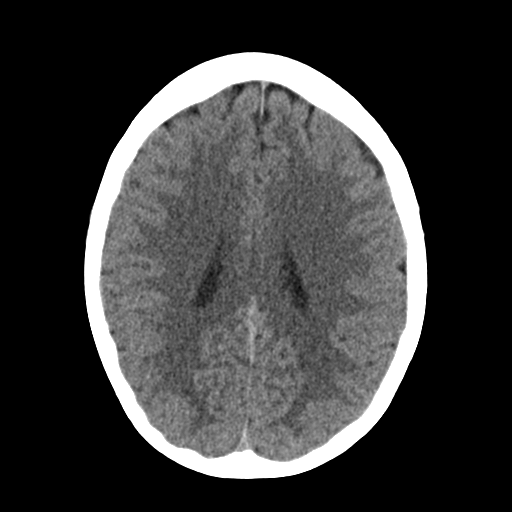
[im 19/31  bone]
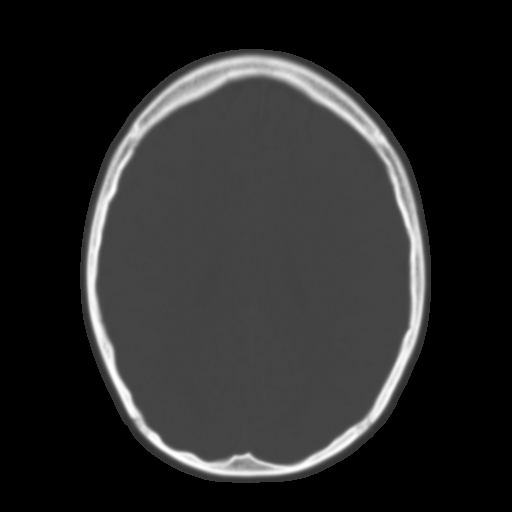
[im 23/31  brain]
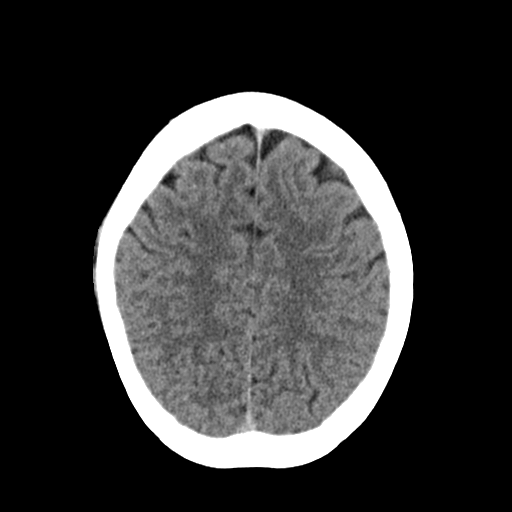
[im 27/31  brain]
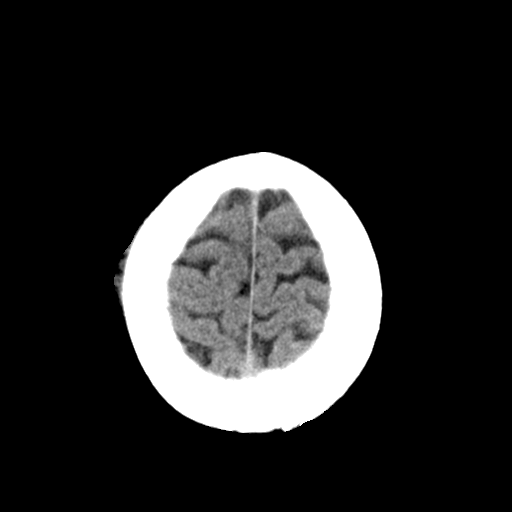

[Series 3: head bone · axial · 0.40mm/px · z∈[+848,+902]mm · 4 of 78 slices shown]
[im 8/78  bone]
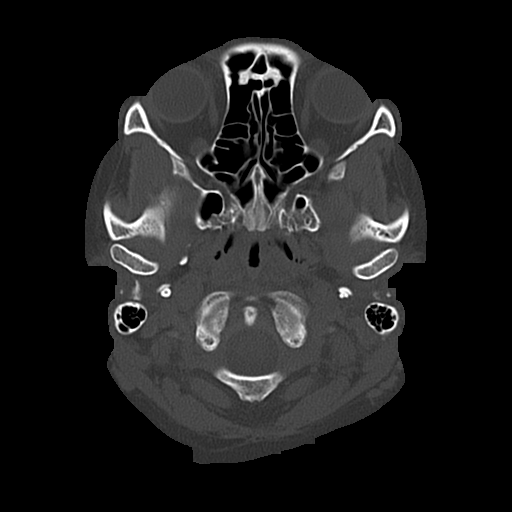
[im 16/78  bone]
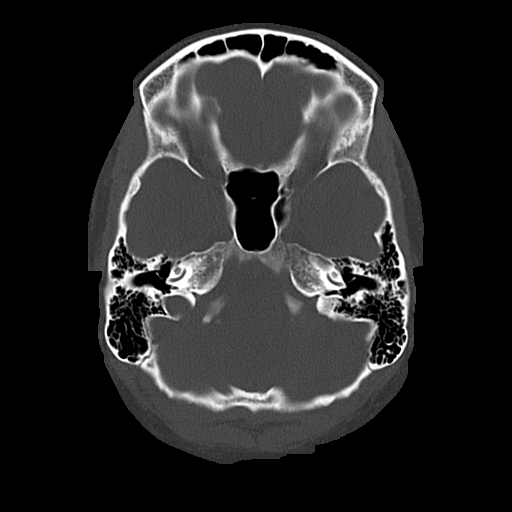
[im 24/78  bone]
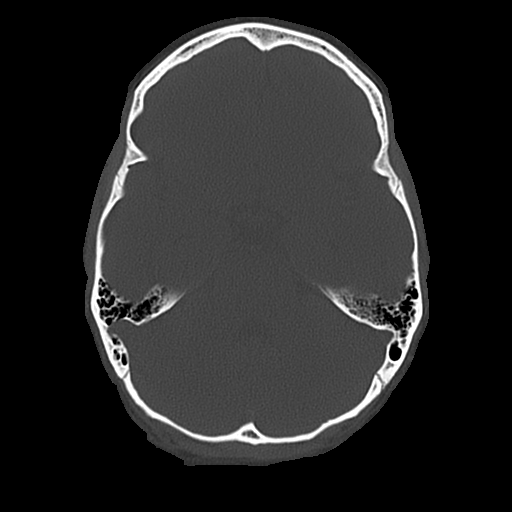
[im 35/78  bone]
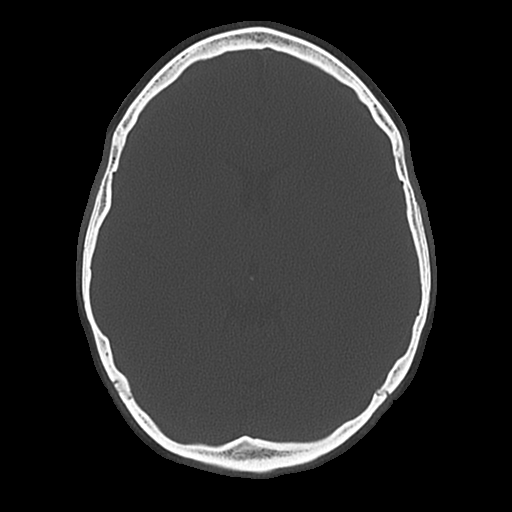

[Series 4: coronal soft · coronal · 0.34mm/px · 3 of 66 slices shown]
[im 22/66  brain]
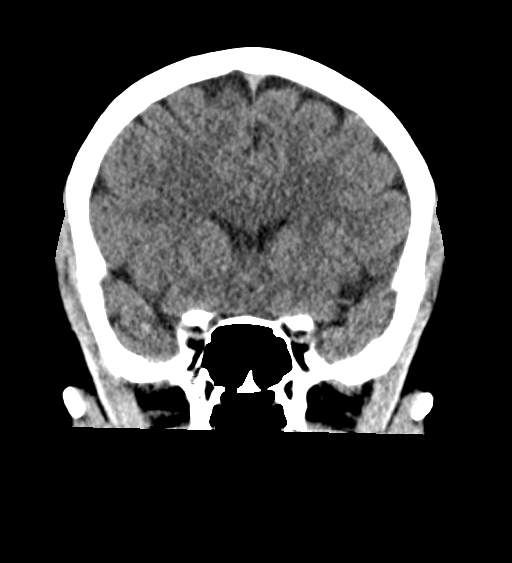
[im 29/66  brain]
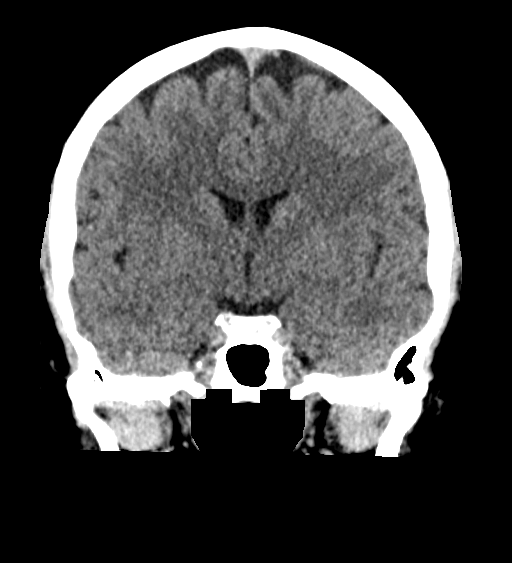
[im 37/66  brain]
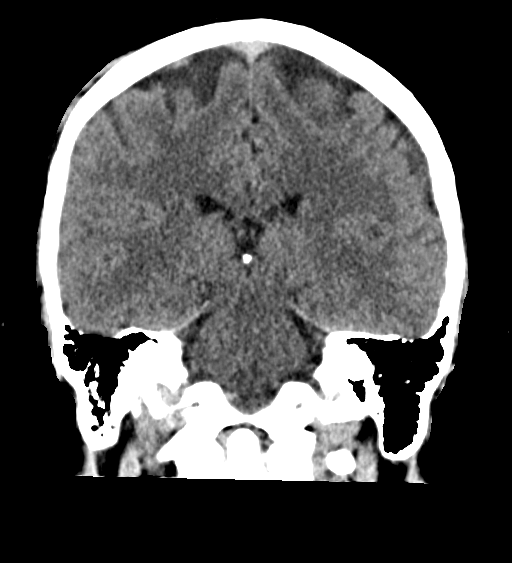

[Series 5: sagittal soft · sagittal · 0.35mm/px · 3 of 58 slices shown]
[im 20/58  brain]
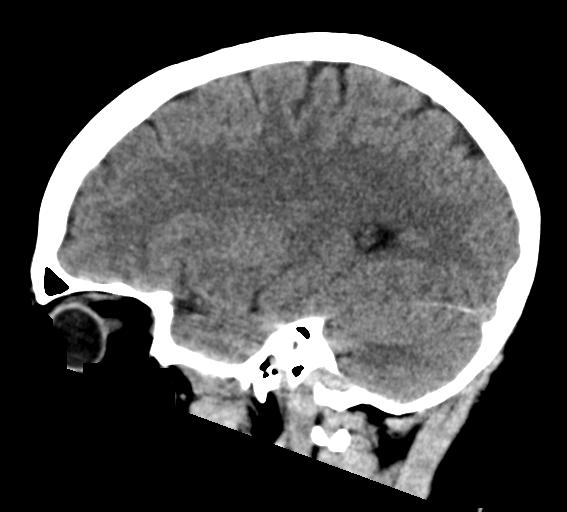
[im 29/58  brain]
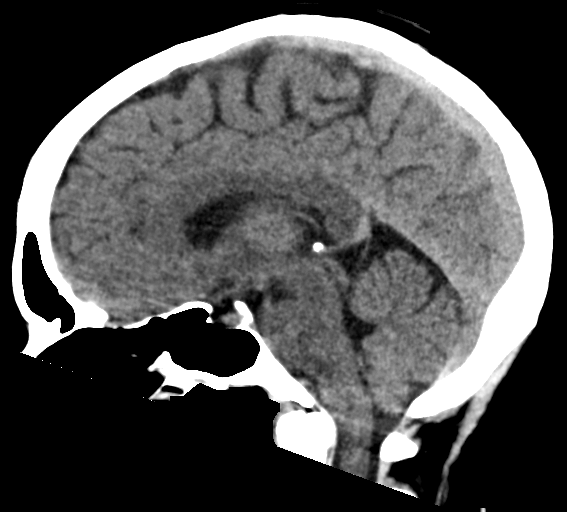
[im 39/58  brain]
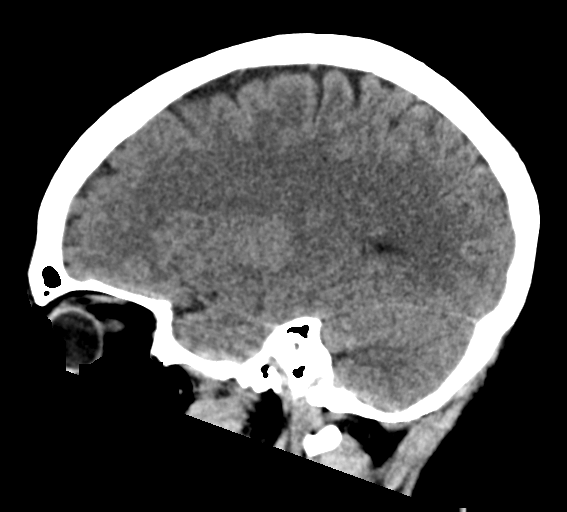

[17 of 47 positions shown; findings below may reference images not displayed]

FINDINGS: Brain: There is no acute intracranial hemorrhage, extra-axial fluid
collection, or acute infarct.

Parenchymal volume is normal. The ventricles are normal in size.
Gray-white differentiation is preserved.

There is no mass lesion.  There is no mass effect or midline shift.

Vascular: No hyperdense vessel or unexpected calcification.

Skull: Normal. Negative for fracture or focal lesion.

Sinuses/Orbits: Imaged paranasal sinuses are clear. The imaged
globes and orbits are unremarkable.

Other: None.
IMPRESSION: Normal head CT.

## 2021-09-02 IMAGING — DX DG KNEE COMPLETE 4+V*R*
4 series · 4 of 4 positions shown · non-contrast
Comparison: None Available.

CLINICAL DATA: Fall, pain

EXAM:
RIGHT KNEE - COMPLETE 4+ VIEW

[knee ap]
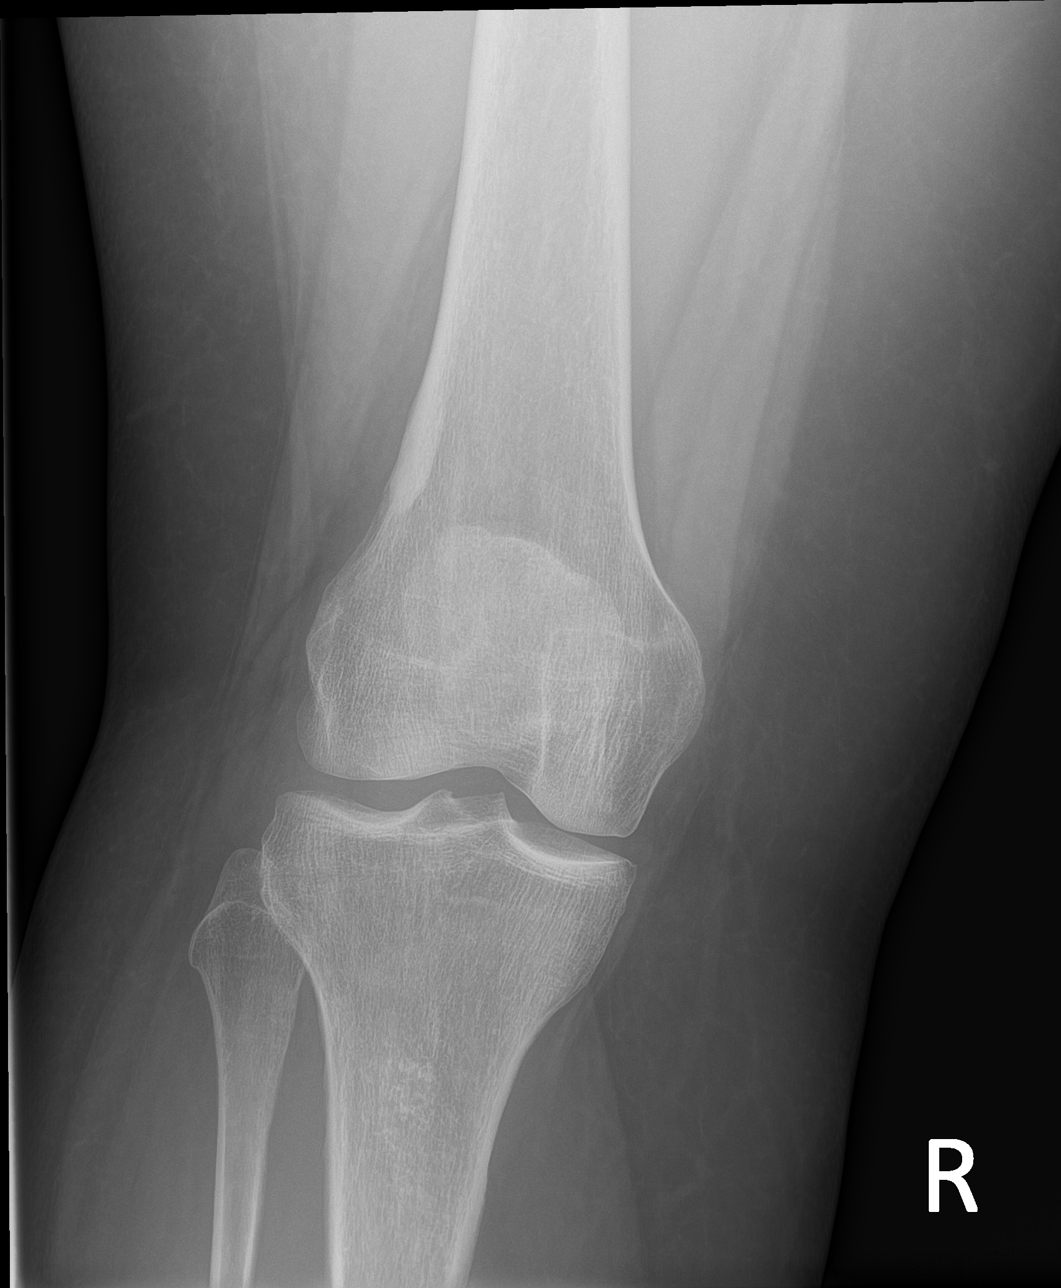

[knee obl (1 of 2)]
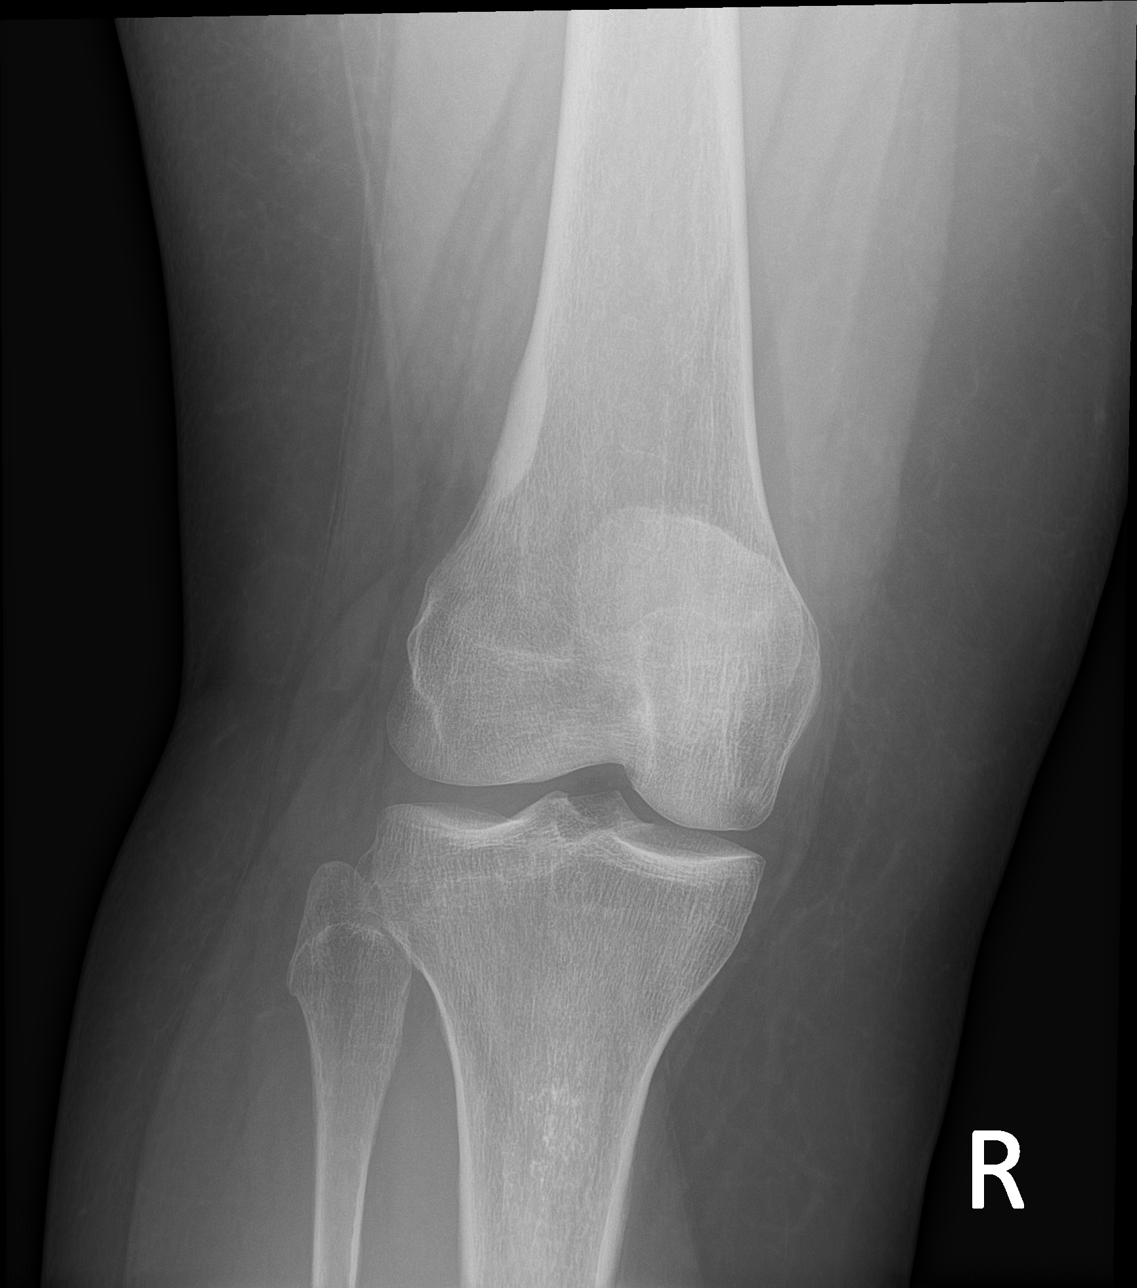

[knee obl (2 of 2)]
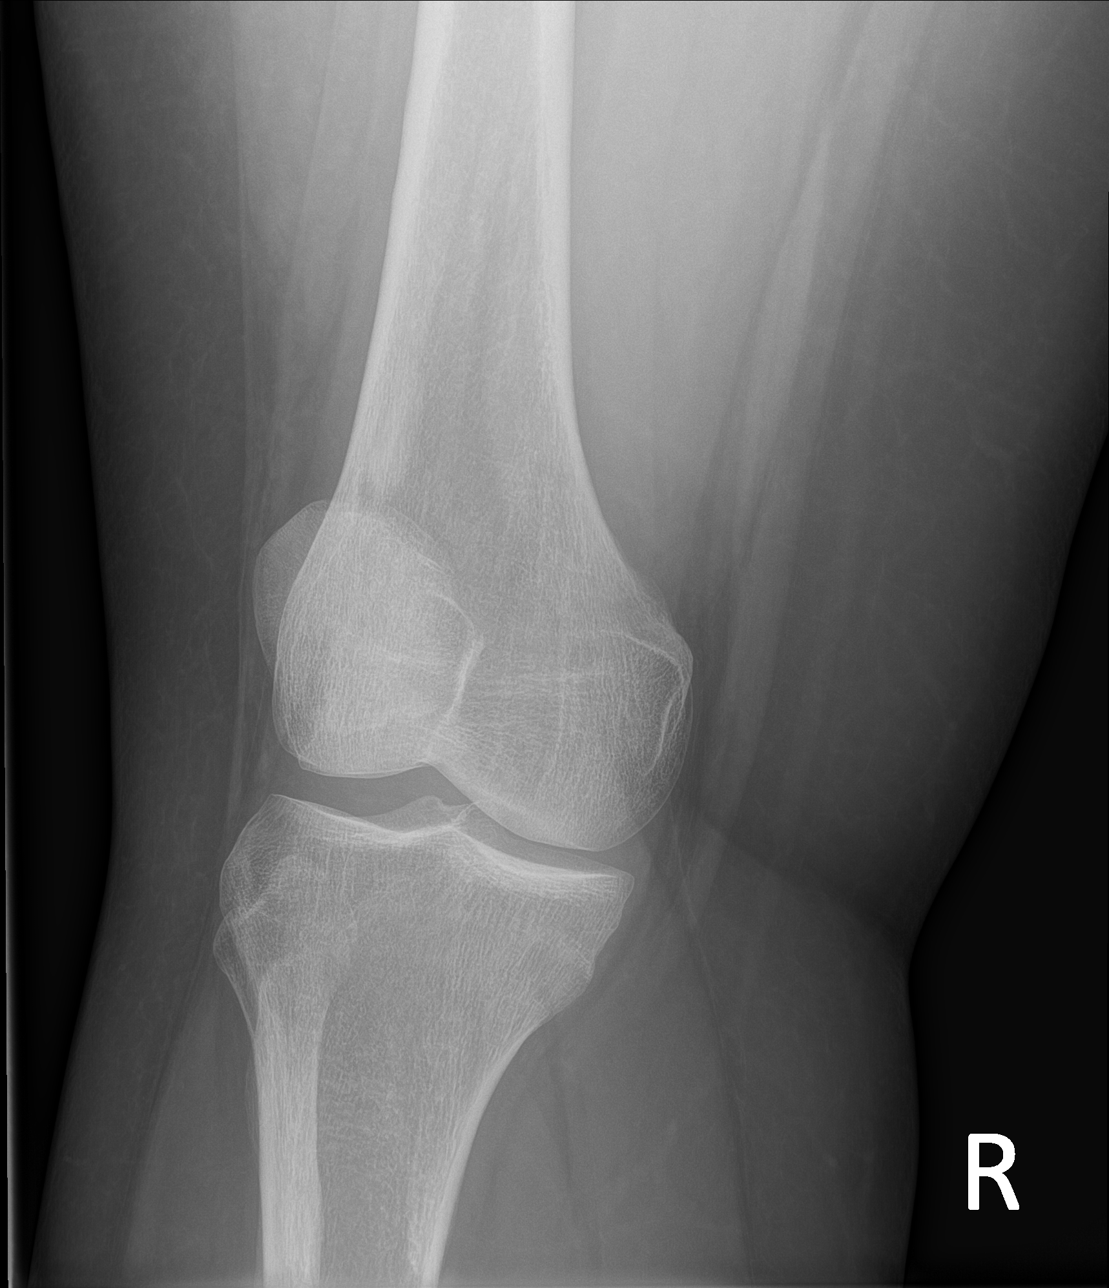

[knee lat]
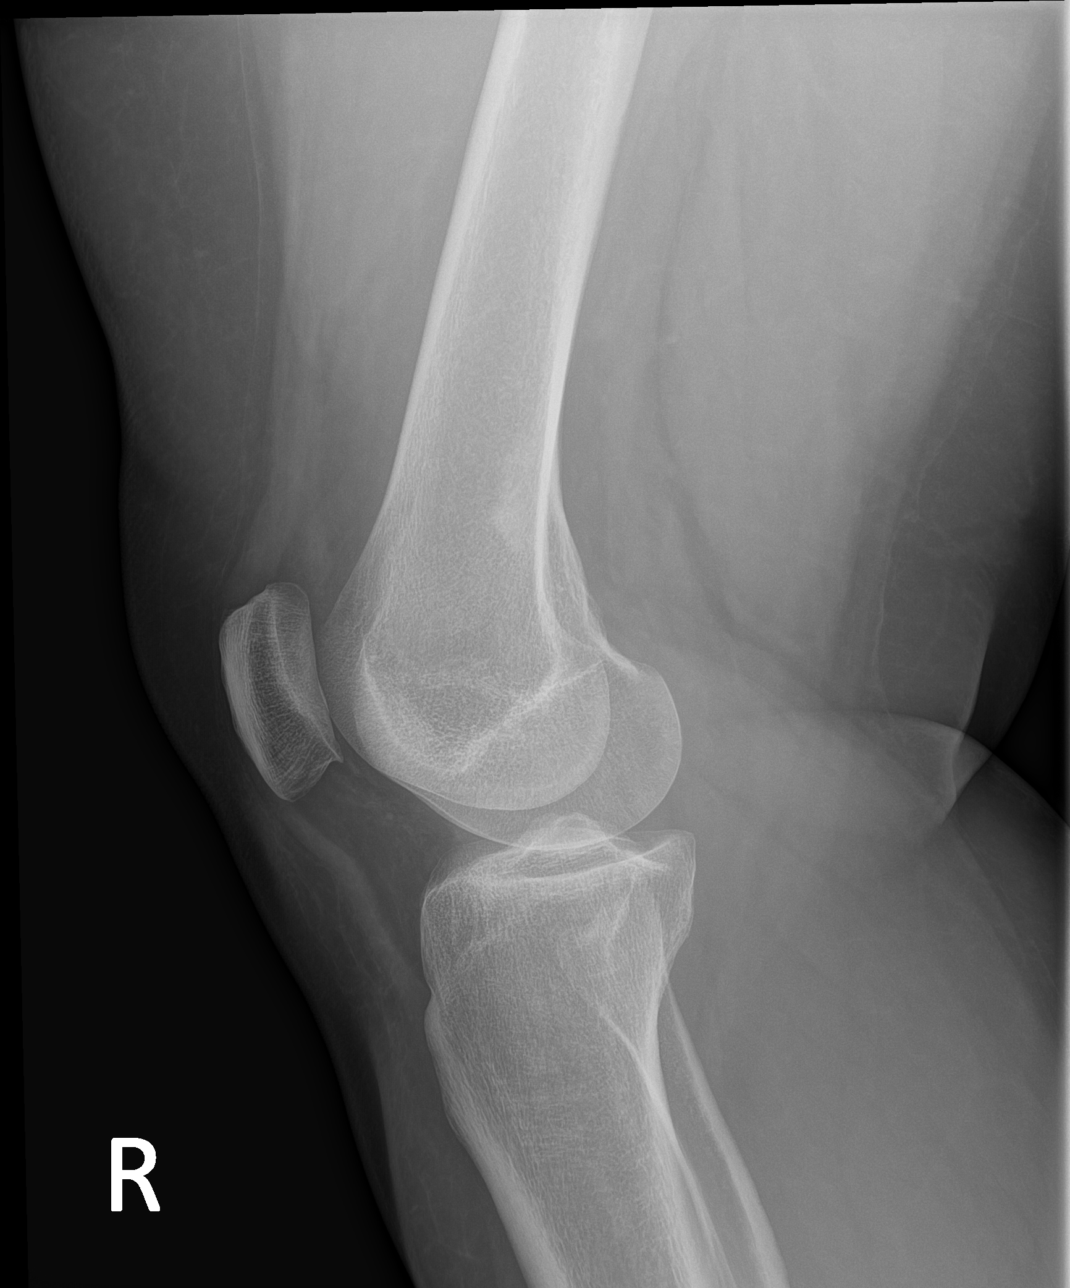

[4 of 4 positions shown; findings below may reference images not displayed]

FINDINGS: No evidence of fracture, dislocation, or joint effusion. No evidence
of arthropathy or other focal bone abnormality. Soft tissues are
unremarkable.
IMPRESSION: Negative.

## 2021-09-02 NOTE — ED Provider Notes (Signed)
?[facility] EMERGENCY DEPT ?Provider Note ? ? ?CSN: [id number] ?Arrival date & time: 09/02/21  1644 ? ?  ? ?History ? ?Chief Complaint  ?Patient presents with  ? Loss of Consciousness  ? ? ?Alexis Yoder is a 36 y.o. female who presents today for evaluation of recurrent syncopal events.  She has been seen by both cardiology and neurology, and seeing vestibular rehab for these, been on a Holter monitor, and has thus far had uneventful work-ups. ?It was noted that she had a brief sinus pause while she was on the monitor when she felt dizzy and so she is awaiting EP appointment. ? ?In addition to her monitor she has had brain MRIs, and echocardiogram, and EEG.  All of these did not show significant causes of her symptoms except for the brief sinus pause. ?She is not currently driving. ? ?For today's event she felt a wave like sensation come over her and felt very warm.  She was in a meeting and had a syncopal event falling out of her chair hitting the floor.  She was reportedly out for only 3 to 5 seconds.  She reports she struck the right side of her head and has pain in her right knee.  She denies any numbness or weakness.  No other injuries reported. ? ?This syncopal episode was not significantly different from her previous however with striking her head and pain in her knee she wanted to primarily get those checked out. ? ? ?HPI ? ? ?  ? ?Home Medications ?Prior to Admission medications   ?Medication Sig Start Date End Date Taking? Authorizing Provider  ?cetirizine (ZYRTEC) 10 MG tablet 1 tablet    [provider]  ?fluticasone (FLONASE) 50 MCG/ACT nasal spray fluticasone propionate 50 mcg/actuation nasal spray,suspension 05/14/14   [provider]  ?meclizine (ANTIVERT) 25 MG tablet Take 25 mg by mouth 3 (three) times daily as needed. 04/25/21   [provider]  ?Melatonin 10 MG CAPS Take by mouth. Takes 2 gummys at hs    [provider]  ?Multiple Vitamin  (MULTIVITAMIN) tablet Take 1 tablet by mouth daily.    [provider]  ?norethindrone-ethinyl estradiol-FE (LOESTRIN FE) 1-20 MG-MCG tablet Take 1 tablet by mouth daily.    [provider]  ?   ? ?Allergies    ?Nickel   ? ?Review of Systems   ?Review of Systems ? ?Physical Exam ?Updated Vital Signs ?BP 110/78 (BP Location: Right Arm)   Pulse 64   Temp 98.7 ?F (37.1 ?C)   Resp 16   Ht 5\' 5"  (1.651 m)   Wt 111.1 kg   SpO2 100%   BMI 40.77 kg/m?  ?Physical Exam ?Vitals and nursing note reviewed.  ?Constitutional:   ?   General: She is not in acute distress. ?   Appearance: She is not ill-appearing.  ?HENT:  ?   Head: Normocephalic.  ?   Comments: Superficial contusion over the right parietal scalp.  No raccoon's eyes or battle signs bilaterally. ?Eyes:  ?   Conjunctiva/sclera: Conjunctivae normal.  ?Cardiovascular:  ?   Rate and Rhythm: Normal rate and regular rhythm.  ?   Pulses: Normal pulses.  ?   Heart sounds: Normal heart sounds.  ?Pulmonary:  ?   Effort: Pulmonary effort is normal. No respiratory distress.  ?Abdominal:  ?   General: There is no distension.  ?   Tenderness: There is no abdominal tenderness.  ?Musculoskeletal:  ?   Cervical back: Normal  range of motion and neck supple. No rigidity or tenderness.  ?   Comments: There is mild edema around the right knee without localized crepitus or deformity.  Right knee is generally tender to palpation. ?Patient is able to flex and extend the right knee however flexion is limited secondary to pain.  She is able to bear weight however walks with a limp.  ?Skin: ?   General: Skin is warm.  ?Neurological:  ?   General: No focal deficit present.  ?   Mental Status: She is alert.  ?   Cranial Nerves: No cranial nerve deficit.  ?   Sensory: No sensory deficit.  ?   Motor: No weakness.  ?   Coordination: Coordination normal.  ?   Comments: Awake and alert, answers all questions appropriately.  Speech is not slurred.  ?Psychiatric:     ?   Mood  and Affect: Mood normal.     ?   Behavior: Behavior normal.  ? ? ?ED Results / Procedures / Treatments   ?Labs ?(all labs ordered are listed, but only abnormal results are displayed) ?Labs Reviewed  ?BASIC METABOLIC PANEL - Abnormal; Notable for the following components:  ?    Result Value  ? Sodium 134 (*)   ? Glucose, Bld 113 (*)   ? All other components within normal limits  ?CBC - Abnormal; Notable for the following components:  ? WBC 13.6 (*)   ? All other components within normal limits  ?CBG MONITORING, ED - Abnormal; Notable for the following components:  ? Glucose-Capillary 116 (*)   ? All other components within normal limits  ?HCG, SERUM, QUALITATIVE  ?TROPONIN I (HIGH SENSITIVITY)  ? ? ?EKG ?None ? ?Radiology ?CT HEAD WO CONTRAST ? ?Result Date: 09/02/2021 ?CLINICAL DATA:  Headache, syncope, patient hit back of head EXAM: CT HEAD WITHOUT CONTRAST TECHNIQUE: Contiguous axial images were obtained from the base of the skull through the vertex without intravenous contrast. RADIATION DOSE REDUCTION: This exam was performed according to the departmental dose-optimization program which includes automated exposure control, adjustment of the mA and/or kV according to patient size and/or use of iterative reconstruction technique. COMPARISON:  Brain MRI 06/19/2021 FINDINGS: Brain: There is no acute intracranial hemorrhage, extra-axial fluid collection, or acute infarct. Parenchymal volume is normal. The ventricles are normal in size. Gray-white differentiation is preserved. There is no mass lesion.  There is no mass effect or midline shift. Vascular: No hyperdense vessel or unexpected calcification. Skull: Normal. Negative for fracture or focal lesion. Sinuses/Orbits: Imaged paranasal sinuses are clear. The imaged globes and orbits are unremarkable. Other: None. IMPRESSION: Normal head CT. Electronically Signed   By: Lesia Hausen M.D.   On: 09/02/2021 17:56  ? ?DG Knee Complete 4 Views Right ? ?Result Date:  09/02/2021 ?CLINICAL DATA:  Fall, pain EXAM: RIGHT KNEE - COMPLETE 4+ VIEW COMPARISON:  None Available. FINDINGS: No evidence of fracture, dislocation, or joint effusion. No evidence of arthropathy or other focal bone abnormality. Soft tissues are unremarkable. IMPRESSION: Negative. Electronically Signed   By: Guadlupe Spanish M.D.   On: 09/02/2021 17:23   ? ?Procedures ?Procedures  ? ?No data found. ? ?No data found. ? ? ?Medications Ordered in ED ?Medications - No data to display ? ?ED Course/ Medical Decision Making/ A&P ?  ?                        ?Medical Decision Making ?Patient is a 36 year old woman  who presents today for evaluation of recurrent syncope. ?She has been seen by cardiology and neurology, abnormal Holter monitor, had brain MRI, and her primary concern today is injuries from the syncopal event.  She struck her head and has pain in her right knee. ?CT head is obtained without intracranial hemorrhage or other acute abnormality. ?X-rays of the right knee are obtained without cause for her pain and symptoms found. ?Additionally labs are obtained, please see below. ?Patient is not currently driving, and syncope precautions are discussed. ?She is instructed to keep her appointment with EP. ?Patient was observed in the emergency room for over 3 hours without a significant worsening or change in condition. ?She appears stable for discharge home at this time. ? ?Return precautions were discussed with patient who states their understanding.  At the time of discharge patient denied any unaddressed complaints or concerns.  Patient is agreeable for discharge home. ? ?Note: Portions of this report may have been transcribed using voice recognition software. Every effort was made to ensure accuracy; however, inadvertent computerized transcription errors may be present ? ? ? ?Problems Addressed: ?Acute pain of right knee: acute illness or injury ?   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 ?Amount and/or Complexity of Data Reviewed ?Labs: ordered. ?   Details: M

## 2021-09-02 NOTE — Discharge Instructions (Signed)
Please keep your EP appointment monitor your MyChart for responses from your cardiologist and neurologist. ? ?Please take Ibuprofen (Advil, motrin) and Tylenol (acetaminophen) to relieve your pain.   ? ?You may take up to 600 MG (3 pills) of normal strength ibuprofen every 8 hours as needed.   ?You make take tylenol, up to 1,000 mg (two extra strength pills) every 8 hours as needed.  ? ?It is safe to take ibuprofen and tylenol at the same time as they work differently.  ? Do not take more than 3,000 mg tylenol in a 24 hour period (not more than one dose every 8 hours.  Please check all medication labels as many medications such as pain and cold medications may contain tylenol.  Do not drink alcohol while taking these medications.  Do not take other NSAID'S while taking ibuprofen (such as aleve or naproxen).  Please take ibuprofen with food to decrease stomach upset. ? ?

## 2021-09-02 NOTE — ED Notes (Signed)
Patient transported to CT 

## 2021-09-02 NOTE — ED Triage Notes (Signed)
Pt arrives to ED with c/o loss of consciousness. Pt reports that she was at work today and suddenly passed out and fell. She reports she knew she was about to pass out because she can typically feel these episodes. She reports she fell and hit her head on the right side. She also reports right knee pain as well.  ?

## 2021-09-02 NOTE — Telephone Encounter (Signed)
Patient mother called and stated that patient passed out at work again today.   [phone]

## 2021-09-03 NOTE — Telephone Encounter (Signed)
From patient.

## 2021-09-10 NOTE — Progress Notes (Signed)
?Electrophysiology Office Note:   ? ?Date:  09/11/2021  ? ?ID:  Alexis Yoder, DOB 04/16/1986, MRN [id number] ? ?PCP:  Glenis Smoker, MD  ?[facility] Cardiologist:  None  ?[facility] Electrophysiologist:  Vickie Epley, MD  ? ?Referring MD: Rex Kras, DO  ? ?Chief Complaint: Syncope ? ?History of Present Illness:   ? ?Alexis Yoder is a 36 y.o. female who presents for an evaluation of syncope at the request of Dr. Terri Skains. Their medical history includes anemia, obesity, vertigo, and nephrolithiasis. ? ?She was last seen by Dr. Terri Skains on 07/21/2021. She noted 3 episodes of syncope since December 22 with her most recent episode on 05/31/21. She had an Echo that showed preserved LVEF. She wore a Holter monitor with 1 patient triggered event found to be sinus rhythm with symptomatic 3.6 second pause. Neurology workup has been unremarkable. She started vestibular physical therapy which has improved her vertigo symptoms. Her syncope was thought to be of vasovagal etiology. Given her multiple episodes of syncope and patient triggered event noting 3.6-second pause she was referred for electrophysiology evaluation. ? ?On 08/24/21 she messaged the office reporting a prodrome of a wave-like sensation, and then a syncopal episode as she stood up following a bowel movement. She reported another episode with a similar prodrome on 09/02/2021. She was sitting at work in a meeting, and passed out as she stood to go out and get some air.  ? ?Today, she is accompanied by her mother. She confirms more frequent syncopal episodes since seeing Dr. Terri Skains. ? ?Last Tuesday she was at work, sitting in a chair after eating lunch. Normally she has a prodrome of feeling a little ill, and then the feeling of a wave-like sensation and feeling warm. Normally she has time to step outside or sit down. However this time she did not and fell out of the chair and lost consciousness for several seconds, which is a typical duration. When she  awoke she was fatigued and had vomiting. ? ?Generally, when she wakes up she can remember what happened just prior to her syncope. She regains clarity quickly. More recently she has not been able to recall what happened immediately before her episodes. Afterwards she also feels like her body has run a marathon, and sometimes is emetic if she has eaten recently. ? ?She had neurology workup to rule out seizures. Multiple EEGs were reportedly negative. Sometimes she has vertigo like symptoms but they don't seem to correlate with her syncopal episodes. ? ?Of note she has been trying to wear compression stockings. She has had syncopal episodes while wearing compression. Also she keeps personal fans with her to counter her prodrome feelings of warmth. ? ?She denies any palpitations, chest pain, shortness of breath, or peripheral edema. No orthopnea, or PND. ? ? ?  ?Past Medical History:  ?Diagnosis Date  ? Anemia   ? Kidney stone   ? Vertigo   ? ? ?Past Surgical History:  ?Procedure Laterality Date  ? KNEE SURGERY Left   ? ? ?Current Medications: ?Current Meds  ?Medication Sig  ? cetirizine (ZYRTEC) 10 MG tablet Take 10 mg by mouth daily.  ? fluticasone (FLONASE) 50 MCG/ACT nasal spray fluticasone propionate 50 mcg/actuation nasal spray,suspension  ? meclizine (ANTIVERT) 25 MG tablet Take 25 mg by mouth 3 (three) times daily as needed.  ? Melatonin 10 MG CAPS Take 2 capsules by mouth at bedtime. Takes 2 gummys at hs  ? Multiple Vitamin (MULTIVITAMIN) tablet Take 1  tablet by mouth daily.  ? norethindrone-ethinyl estradiol-FE (LOESTRIN FE) 1-20 MG-MCG tablet Take 1 tablet by mouth daily.  ?  ? ?Allergies:   Nickel  ? ?Social History  ? ?Socioeconomic History  ? Marital status: Single  ?  Spouse name: Not on file  ? Number of children: 0  ? Years of education: Not on file  ? Highest education level: Not on file  ?Occupational History  ? Occupation: Psychologist, counselling education  ?Tobacco Use  ? Smoking status: Never  ? Smokeless  tobacco: Not on file  ?Vaping Use  ? Vaping Use: Never used  ?Substance and Sexual Activity  ? Alcohol use: Yes  ?  Comment: Weekly  ? Drug use: No  ? Sexual activity: Not on file  ?Other Topics Concern  ? Not on file  ?Social History Narrative  ? Right handed  ? Drinks caffeine  ? One story home  ? ?Social Determinants of Health  ? ?Financial Resource Strain: Not on file  ?Food Insecurity: Not on file  ?Transportation Needs: Not on file  ?Physical Activity: Not on file  ?Stress: Not on file  ?Social Connections: Not on file  ?  ? ?Family History: ?The patient's family history includes Breast cancer in her mother; Diabetes in her father. ? ?ROS:   ?Please see the history of present illness.    ?(+) Syncope ?(+) Fatigue ?(+) Lightheadedness/Dizziness ?(+) Emesis ?All other systems reviewed and are negative. ? ?EKGs/Labs/Other Studies Reviewed:   ? ?The following studies were reviewed today: ? ?07/2021  Monitor ?14 day extended Holter monitor: ?Dominant rhythm normal sinus. ?Heart rate 39-151 bpm.  Avg HR 68 bpm. ?No atrial fibrillation, supraventricular tachycardia, ventricular tachycardia, high grade AV block. ?Total ventricular ectopic burden <1%. ?Total supraventricular ectopic burden <1%. ?Patient triggered events: 1.  Underlying rhythm sinus with 3.6-second pause, 07/01/2021 at 7:11 AM.  ? ?06/11/2021  PCV Echocardiogram ?Left ventricle cavity is normal in size and wall thickness. Normal global wall motion. Normal LV systolic function with EF 56%. Normal diastolic filling pattern.  ?No significant valvular abnormality.  ?Normal right atrial pressure.  ? ? ?EKG:   EKG are personally reviewed.  ? ? ? ?Recent Labs: ?09/02/2021: BUN 13; Creatinine, Ser 0.61; Hemoglobin 13.4; Platelets 361; Potassium 4.0; Sodium 134  ? ?Recent Lipid Panel ?No results found for: CHOL, TRIG, HDL, CHOLHDL, VLDL, LDLCALC, LDLDIRECT ? ?Physical Exam:   ? ?VS:  BP 126/88   Pulse 86   Ht 5\' 6"  (1.676 m)   Wt 259 lb 12.8 oz (117.8 kg)   SpO2  98%   BMI 41.93 kg/m?    ? ?Wt Readings from Last 3 Encounters:  ?09/11/21 259 lb 12.8 oz (117.8 kg)  ?09/02/21 245 lb (111.1 kg)  ?08/25/21 255 lb 11.7 oz (116 kg)  ?  ? ?GEN: Well nourished, well developed in no acute distress ?HEENT: Normal ?NECK: No JVD; No carotid bruits ?LYMPHATICS: No lymphadenopathy ?CARDIAC: RRR, no murmurs, rubs, gallops ?RESPIRATORY:  Clear to auscultation without rales, wheezing or rhonchi  ?ABDOMEN: Soft, non-tender, non-distended ?MUSCULOSKELETAL:  No edema; No deformity  ?SKIN: Warm and dry ?NEUROLOGIC:  Alert and oriented x 3 ?PSYCHIATRIC:  Normal affect  ? ? ?  ? ?ASSESSMENT:   ? ?1. Loss of consciousness ([place])   ?2. Syncope and collapse   ?3. Vasovagal syncope   ? ?PLAN:   ? ?In order of problems listed above: ? ?#Syncope ?#Vasovagal syncope ?Her symptoms are very classic for vasovagal syncope.  Each are  preceded by a reliable prodrome of nausea, warmth, sweating.  Prodrome last seconds before she passes out and wakes up feeling back to her normal self.  We discussed the pathophysiology of vasovagal syncope during today's visit.  We discussed counterpressure maneuvers which I recommended to avoid complete loss of consciousness.  She should avoid standing up when the symptoms began.  She should avoid driving for 6 months from her last syncopal episode.  I do not think further work-up is indicated at this time.  She should also try liberalizing salt intake and fluids.  She can also try compression stockings and recumbent exercise. ? ?Follow-up with me as needed. ? ?Medication Adjustments/Labs and Tests Ordered: ?Current medicines are reviewed at length with the patient today.  Concerns regarding medicines are outlined above.  ?No orders of the defined types were placed in this encounter. ? ?No orders of the defined types were placed in this encounter. ? ? ?I,Mathew Stumpf,acting as a scribe for Vickie Epley, MD.,have documented all relevant documentation on the behalf of  Vickie Epley, MD,as directed by  Vickie Epley, MD while in the presence of Vickie Epley, MD. ? ?I, Vickie Epley, MD, have reviewed all documentation for this visit. The documentation on 05/11/2

## 2021-09-11 ENCOUNTER — Ambulatory Visit: Payer: BC Managed Care – PPO | Admitting: Cardiology

## 2021-09-11 ENCOUNTER — Institutional Professional Consult (permissible substitution): Payer: BC Managed Care – PPO | Admitting: Cardiology

## 2021-09-11 ENCOUNTER — Encounter: Payer: Self-pay | Admitting: Cardiology

## 2021-09-11 VITALS — BP 126/88 | HR 86 | Ht 66.0 in | Wt 259.8 lb

## 2021-09-11 DIAGNOSIS — R55 Syncope and collapse: Secondary | ICD-10-CM

## 2021-09-11 DIAGNOSIS — R4020 Unspecified coma: Secondary | ICD-10-CM

## 2021-09-11 NOTE — Patient Instructions (Addendum)
Medication Instructions:  ?Your physician recommends that you continue on your current medications as directed. Please refer to the Current Medication list given to you today. ?*If you need a refill on your cardiac medications before your next appointment, please call your pharmacy* ? ?Lab Work: ?None. ?If you have labs (blood work) drawn today and your tests are completely normal, you will receive your results only by: ?MyChart Message (if you have MyChart) OR ?A paper copy in the mail ?If you have any lab test that is abnormal or we need to change your treatment, we will call you to review the results. ? ?Testing/Procedures: ?None. ? ?Follow-Up: ?At [facility], you and your health needs are our priority.  As part of our continuing mission to provide you with exceptional heart care, we have created designated Provider Care Teams.  These Care Teams include your primary Cardiologist (physician) and Advanced Practice Providers (APPs -  Physician Assistants and Nurse Practitioners) who all work together to provide you with the care you need, when you need it. ? ?Your physician wants you to follow-up in: As needed with Steffanie Dunn, MD  ? ?We recommend signing up for the patient portal called "MyChart".  Sign up information is provided on this After Visit Summary.  MyChart is used to connect with patients for Virtual Visits (Telemedicine).  Patients are able to view lab/test results, encounter notes, upcoming appointments, etc.  Non-urgent messages can be sent to your provider as well.   ?To learn more about what you can do with MyChart, go to [email or web address].   ? ?Any Other Special Instructions Will Be Listed Below (If Applicable). ? ?No driving for 6 months after a passing out episode.  ? ? ? ? ?  ? ? ?

## 2021-09-22 ENCOUNTER — Other Ambulatory Visit: Payer: Self-pay | Admitting: Sports Medicine

## 2021-09-22 DIAGNOSIS — S83241A Other tear of medial meniscus, current injury, right knee, initial encounter: Secondary | ICD-10-CM

## 2021-09-28 ENCOUNTER — Ambulatory Visit
Admission: RE | Admit: 2021-09-28 | Discharge: 2021-09-28 | Disposition: A | Discharge status: Home / Self Care | Payer: BC Managed Care – PPO | Source: Ambulatory Visit | Attending: Sports Medicine | Admitting: Sports Medicine

## 2021-09-28 DIAGNOSIS — S83241A Other tear of medial meniscus, current injury, right knee, initial encounter: Secondary | ICD-10-CM

## 2021-09-28 IMAGING — MR MR KNEE*R* W/O CM
6 of 7 series · 28 of 40 positions shown · non-contrast
Comparison: Radiographs dated [DATE]

CLINICAL DATA: Fell off a chair a month ago.  Right knee pain.

EXAM:
MRI OF THE RIGHT KNEE WITHOUT CONTRAST
TECHNIQUE: Multiplanar, multisequence MR imaging of the right knee was
performed. No intravenous contrast was administered.

[Series 3: [id number]_fs_tra · axial · 4.0mm · 0.70mm/px · z∈[-68,-43]mm · 2 of 34 slices shown]
[im 1/34]
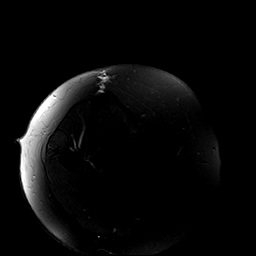
[im 6/34]
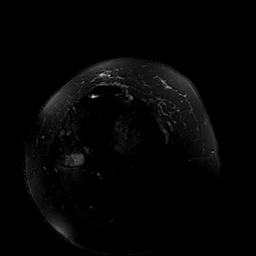

[Series 4: T2 fat-sat · coronal · 4.0mm · 0.66mm/px · 6 of 28 slices shown (1 of 3)]
[im 1/28]
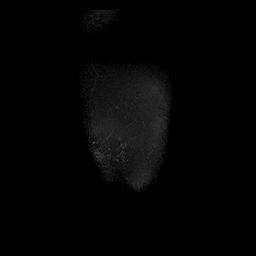
[im 6/28]
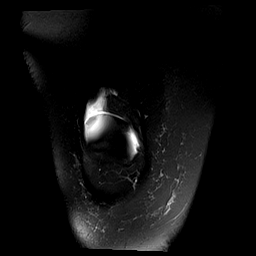
[im 11/28]
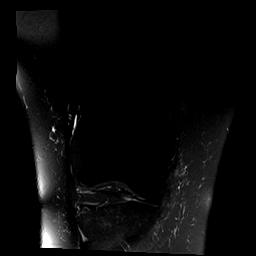
[im 17/28]
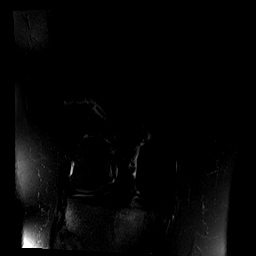
[im 22/28]
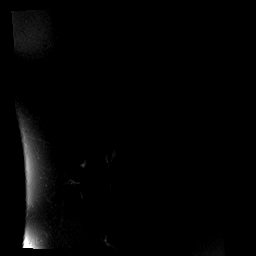
[im 28/28]
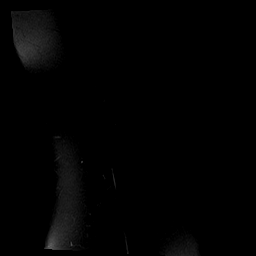

[Series 5: T1 · coronal · 4.0mm · 0.33mm/px · 5 of 28 slices shown]
[im 1/28]
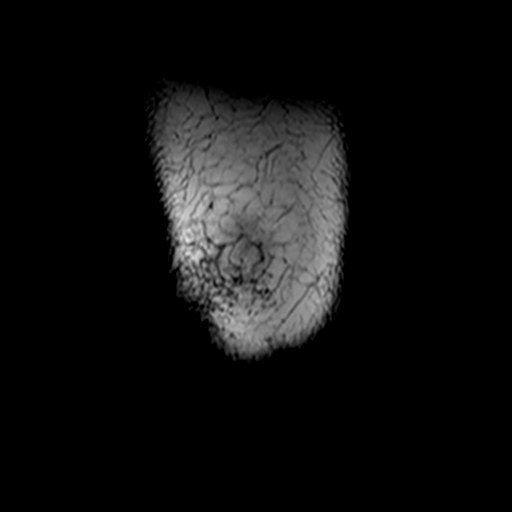
[im 7/28]
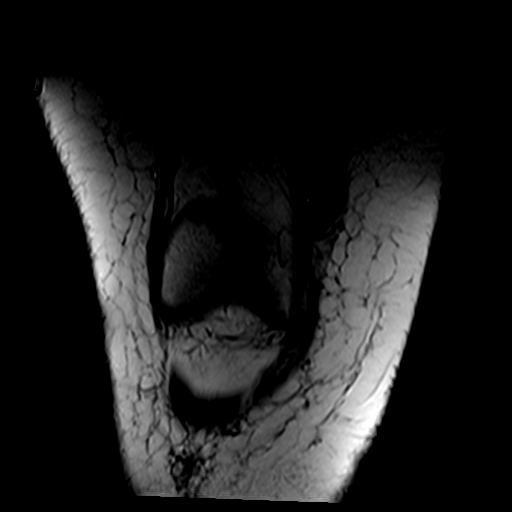
[im 14/28]
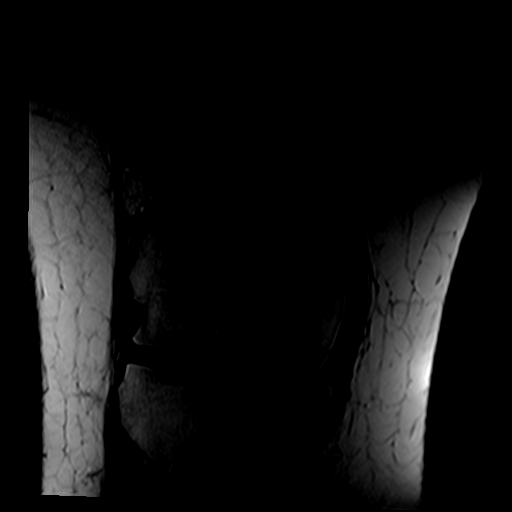
[im 21/28]
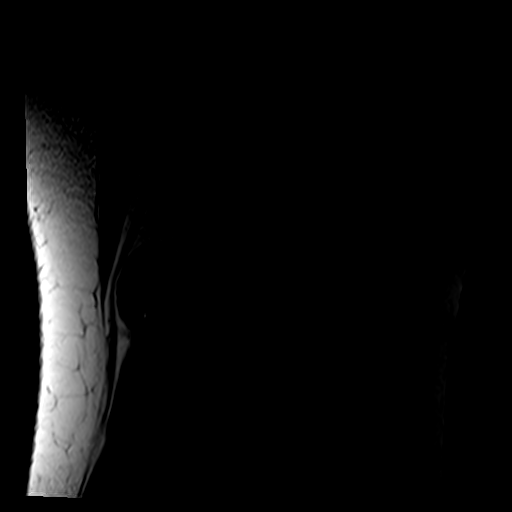
[im 28/28]
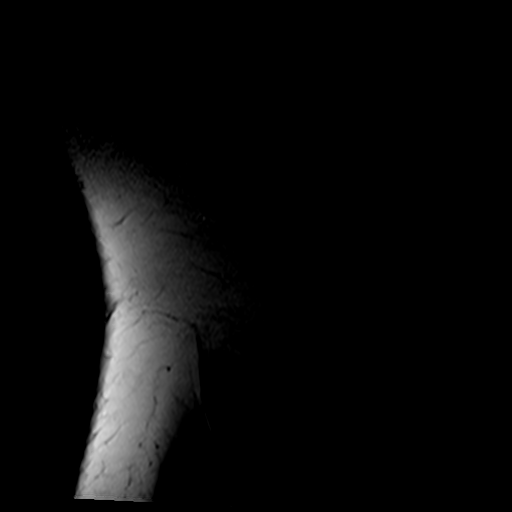

[Series 6: T2 fat-sat · coronal · 4.0mm · 0.66mm/px · 5 of 28 slices shown (2 of 3)]
[im 1/28]
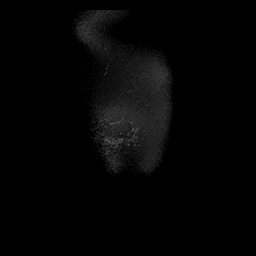
[im 7/28]
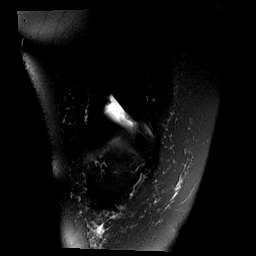
[im 14/28]
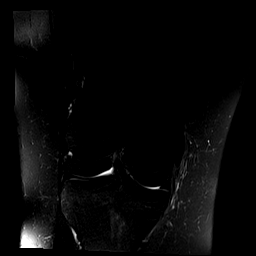
[im 21/28]
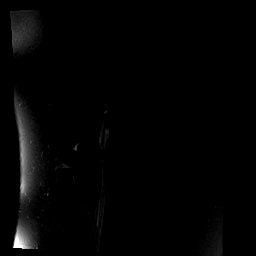
[im 28/28]
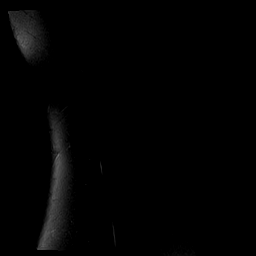

[Series 8: T2 fat-sat · sagittal · 3.0mm · 0.31mm/px · 5 of 29 slices shown (3 of 3)]
[im 1/29]
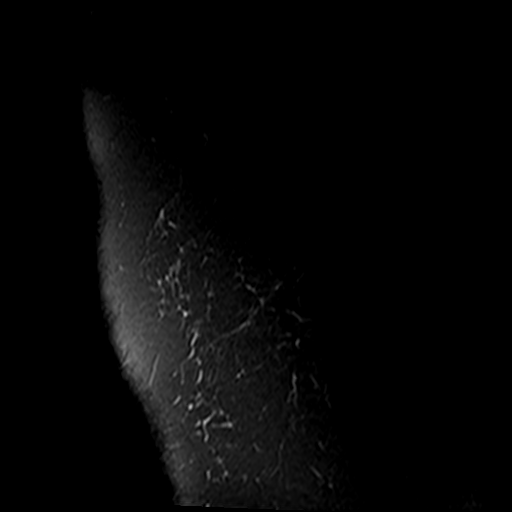
[im 8/29]
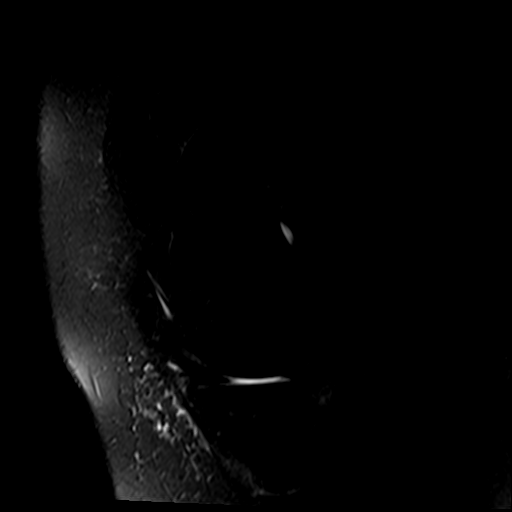
[im 15/29]
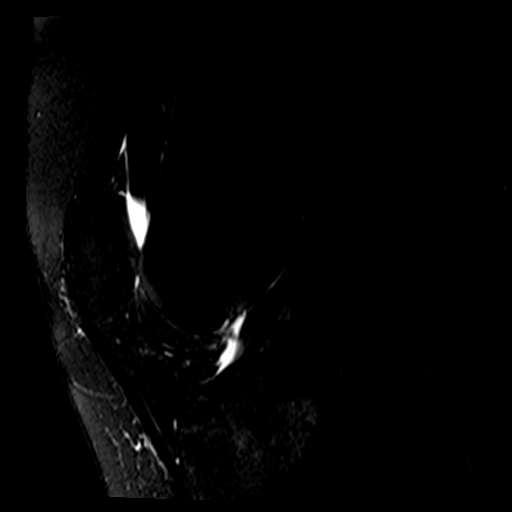
[im 22/29]
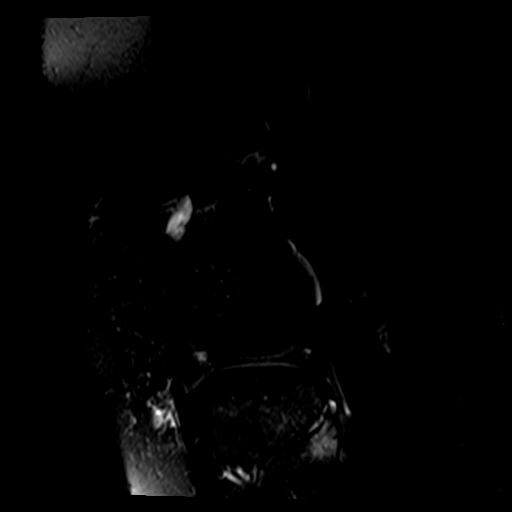
[im 29/29]
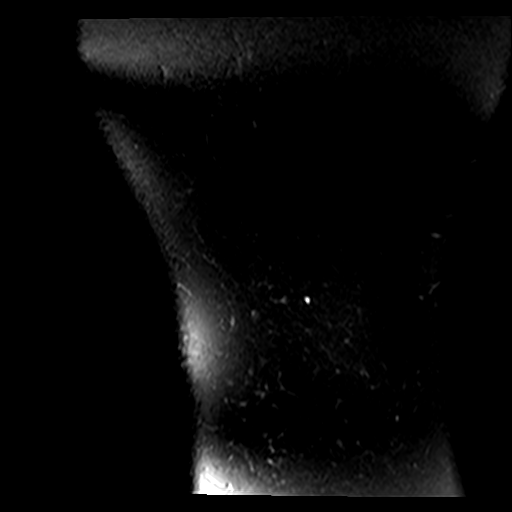

[Series 9: PD fat-sat · sagittal · 3.0mm · 0.31mm/px · 5 of 29 slices shown]
[im 1/29]
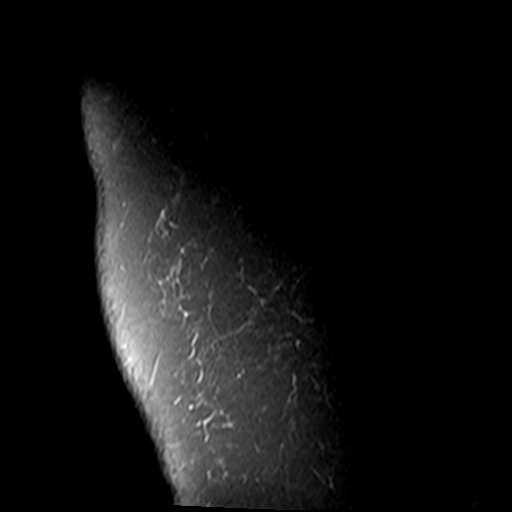
[im 8/29]
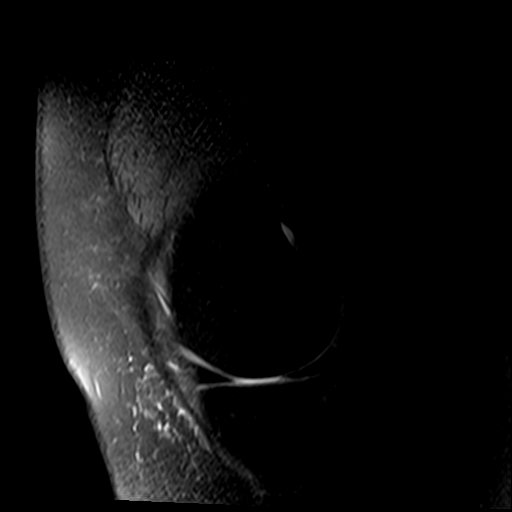
[im 15/29]
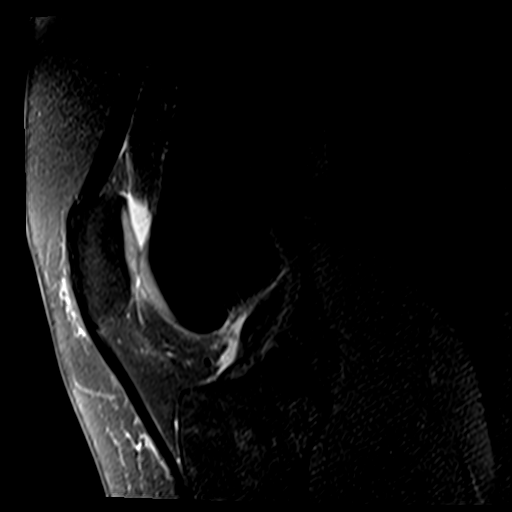
[im 22/29]
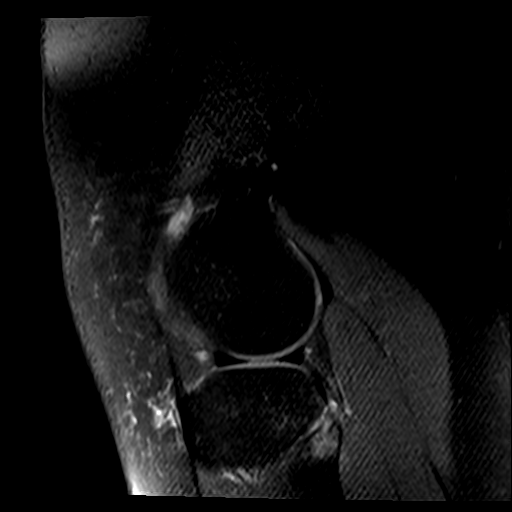
[im 29/29]
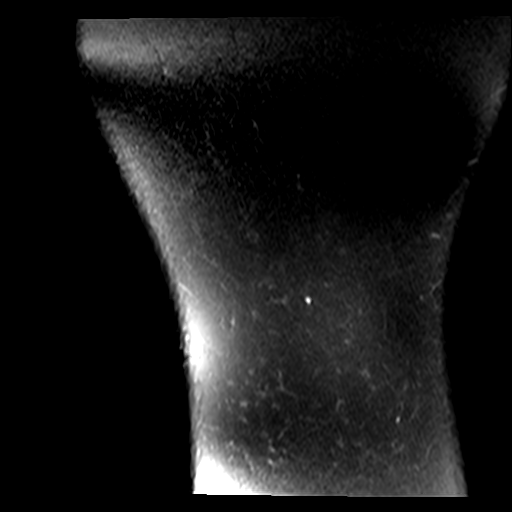

[28 of 40 positions shown; findings below may reference images not displayed]

FINDINGS: MENISCI

Medial: Intact.

Lateral: Intact.

LIGAMENTS

Cruciates: ACL and PCL are intact.

Collaterals: Medial collateral ligament is intact. Lateral
collateral ligament complex is intact.

CARTILAGE

Patellofemoral:  No chondral defect.

Medial:  No chondral defect.

Lateral:  No chondral defect.

JOINT: Small joint effusion. Normal ROGERS. No plical
thickening.

POPLITEAL FOSSA: Popliteus tendon is intact. No Baker's cyst.

EXTENSOR MECHANISM: Intact quadriceps tendon. Intact patellar
tendon. Intact lateral patellar retinaculum. Intact medial patellar
retinaculum. Intact MPFL.

BONES: Bone marrow edema of the posterior aspect of the medial
tibial plateau as well as posterior aspect of the lateral tibial
plateau and adjacent fibular. No aggressive osseous lesion. No
fracture or dislocation.

Other: No fluid collection or hematoma. Muscles are normal.
IMPRESSION: 1. Bone contusion of the posterior aspect of the medial tibial
plateau as well as lateral tibial plateau and adjacent fibular
without evidence of displaced fracture.

2.  Small joint effusion.

3.  Medial and lateral menisci are intact.

4. Cruciate and collateral ligaments are intact. Quadriceps tendon
and patellar tendon are also maintained.

5. Muscles, tendons and subcutaneous soft tissues are also
unremarkable.

## 2021-10-14 ENCOUNTER — Ambulatory Visit: Payer: BC Managed Care – PPO | Admitting: Neurology

## 2021-10-14 ENCOUNTER — Ambulatory Visit: Payer: BC Managed Care – PPO | Admitting: Cardiology

## 2021-10-14 ENCOUNTER — Encounter: Payer: Self-pay | Admitting: Neurology

## 2021-10-14 VITALS — BP 129/83 | HR 62 | Wt 246.0 lb

## 2021-10-14 DIAGNOSIS — R42 Dizziness and giddiness: Secondary | ICD-10-CM

## 2021-10-14 DIAGNOSIS — R55 Syncope and collapse: Secondary | ICD-10-CM | POA: Diagnosis not present

## 2021-10-14 NOTE — Progress Notes (Signed)
NEUROLOGY FOLLOW UP OFFICE NOTE  Alexis Yoder [id number] 02/09/1986  HISTORY OF PRESENT ILLNESS: I had the pleasure of seeing Alexis Yoder in follow-up in the neurology clinic on 10/14/2021.  The patient was last seen 3 months ago for episodes of syncope. She is alone in the office today. Records and images were personally reviewed where available.  Brain MRI with and without contrast, MRA head, no acute changes, hippocampi symmetric, no flow-limiting stenosis seen. Her 1-hour and 24-hour EEG were normal, typical events were not captured. Episodes of dizziness and headache did not show EEG change. She reports the vertigo has completely resolved with Vestibular therapy. She also has the Epley maneuver which helped to the point she did not need meclizine. Since her last visit, she reports an episode in April where she felt a prodrome of a wave-like sensation, then had a syncopal episode was she stood up following a bowel movement. She had another prodrome on 5/2 at work where she felt the wave-like sensation and felt warm, before she could step out to get some air, she lost consciousness while still sitting, she fell out of the chair and hit the floor, out between 3-5 seconds. She wore a holter monitor, there was a 3.6 second pause. She was evaluated by EP with no further testing recommended, with symptoms very classic for vasovagal syncope. She reports the last episode was on 5/9 at work after eating lunch where she did not have the typical prodrome and fell out of the chair, lost consciousness for a few seconds. She woke up feeling tired and vomited. She still has occasional times where she has the wave sensation, like floating, not even lasting 1.5 seconds but would not progress when she would do counterpressure maneuvers. She denies any headaches, vision changes, focal numbness/tingling/weakness.    History on Initial Assessment 05/19/2021: This is a pleasant 36 year old right-handed woman with no  significant past medical history, presenting for evaluation of 2 episodes of syncope. She is accompanied by her mother who helps supplement the history today. She recalls having a syncopal episode in middle school, her mother was braiding her hair by the sink and she fell forward. She recalls that she was getting ready to start her period at that time. She only recalls feeling sleepy, no focal weakness. She had another syncopal episode in late high school/early college with no prior warning, she was told she was anemic and took iron pills for a time. She did well for several years until 04/23/2021. She recalls feeling fine that day, she saw her chiropractor who did adjustments (in her back and neck), then stopped by [employer] for food. She recalls feeling a little nauseated in the car, which is not unusual with history of carsickness. They were visiting her grandmother in [place] and arrived there feeling hungry. She had dinner and then while sitting felt like the room was spinning. She alerted her mother and recalls feeling very warm. Her mother put a wet washcloth on, she felt fine for 30 minutes and started crocheting, when she again felt the same sensation and lost consciousness, waking up to her mother calling her. Her mother reports that she was sitting and then her head went down and her arms flexed on her chest, no convulsive activity. Episode lasted 15-20 seconds. She woke up and felt warm again, vomiting twice en route to the ER. She recalls having a head CT and EKG that were reportedly normal, diagnosed with vertigo and discharged home  on meclizine. Over the next month, she would have brief episodes of feeling "swimmyheaded" and would take meclizine which seemed to curb sensation. No further spinning sensation until 05/10/2021. She felt fine, she had been up and down steps helping pack the house. At dinner, she ate and was sitting on the recliner when she felt the same spinning sensation. She took  meclizine and felt fine for 10-20 minutes. She got up and sat on the kitchen chair then felt the spinning sensation and again lost consciousness also for only a few seconds. She did not fall off the chair, she was able to lay her head on the back of the chair and felt warm and nauseated when she woke up. Her body felt like she had run a marathon, she slept until the next day. There was a little burning sensation in her chest, no palpitations. She still has a little bit of the swimmy-headed sensation right now when she moves her head back and forth. 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 Her mother denies any staring/unresponsive episodes. She denies any focal numbness/tingling/weakness, myoclonic jerks. She has occasional tension headaches. She denies any neck pain. She usually gets 6-7 hours of sleep, melatonin helps. She denies any sleep deprivation or alcohol prior to the events. Memory is really good. She lives with her parents and works as a Arts administratorteacher for special education in [employer]. Her grandmother (who was my patient) had seizures. She had a normal birth and early development.  There is no history of febrile convulsions, CNS infections such as meningitis/encephalitis, significant traumatic brain injury, neurosurgical procedures.   PAST MEDICAL HISTORY: Past Medical History:  Diagnosis Date   Anemia    Kidney stone    Vertigo     MEDICATIONS: Current Outpatient Medications on File Prior to Visit  Medication Sig Dispense Refill   cetirizine (ZYRTEC) 10 MG tablet Take 10 mg by mouth daily.     fluticasone (FLONASE) 50 MCG/ACT nasal spray fluticasone propionate 50 mcg/actuation nasal spray,suspension     meclizine (ANTIVERT) 25 MG tablet Take 25 mg by mouth 3 (three) times daily as needed.     Melatonin 10 MG CAPS Take  2 capsules by mouth at bedtime. Takes 2 gummys at hs     Multiple Vitamin (MULTIVITAMIN) tablet Take 1 tablet by mouth daily.     norethindrone-ethinyl estradiol-FE (LOESTRIN FE) 1-20 MG-MCG tablet Take 1 tablet by mouth daily.     No current facility-administered medications on file prior to visit.    ALLERGIES: Allergies  Allergen Reactions   Nickel Rash and Other (See Comments)    FAMILY HISTORY: Family History  Problem Relation Age of Onset   Breast cancer Mother    Diabetes Father     SOCIAL HISTORY: Social History   Socioeconomic History   Marital status: Single    Spouse name: Not on file   Number of children: 0   Years of education: Not on file   Highest education level: Not on file  Occupational History   Occupation: teacher special education  Tobacco Use   Smoking status: Never   Smokeless tobacco: Not on file  Vaping Use   Vaping Use: Never used  Substance and Sexual Activity   Alcohol use: Yes    Comment: Weekly   Drug use: No  Sexual activity: Not on file  Other Topics Concern   Not on file  Social History Narrative   Right handed   Drinks caffeine 12 oz a day   One story home   Social Determinants of Health   Financial Resource Strain: Not on file  Food Insecurity: Not on file  Transportation Needs: Not on file  Physical Activity: Not on file  Stress: Not on file  Social Connections: Not on file  Intimate Partner Violence: Not on file     PHYSICAL EXAM: Vitals:   10/14/21 1430  BP: 129/83  Pulse: 62  SpO2: 98%   General: No acute distress Head:  Normocephalic/atraumatic Skin/Extremities: No rash, no edema Neurological Exam: alert and awake. No aphasia or dysarthria. Fund of knowledge is appropriate. Attention and concentration are normal.   Cranial nerves: Pupils equal, round. Extraocular movements intact.  No facial asymmetry.  Motor: moves all extremities symmetrically, at least anti-gravity x 4. 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 Neurological evaluation normal, including brain MRI/MRA and 24-hour EEG. She denies any episodes since 09/09/2021. Continue hydration, liberalize salt intake, counterpressure maneuvers, and regular exercise. Follow-up in 6 months, call for any changes.    Thank you for allowing me to participate in her care.  Please do not hesitate to call for any questions or concerns.    Patrcia Dolly, M.D.   CC: Dr. Chanetta Marshall

## 2021-10-14 NOTE — Patient Instructions (Signed)
Good to see you. Continue with compression stockings (or can try abdominal binder also), increasing hydration, counterpressure exercises, regular exercise, and strategies to use when you are feeling symptoms (get to a supine position if possible). Follow-up in 6 months, call for any changes.

## 2021-10-16 ENCOUNTER — Ambulatory Visit: Payer: BC Managed Care – PPO | Admitting: Student

## 2021-10-16 ENCOUNTER — Encounter: Payer: Self-pay | Admitting: Student

## 2021-10-16 VITALS — BP 126/82 | HR 76 | Temp 97.2°F | Resp 17 | Ht 66.0 in | Wt 245.4 lb

## 2021-10-16 DIAGNOSIS — R55 Syncope and collapse: Secondary | ICD-10-CM

## 2021-10-16 NOTE — Progress Notes (Signed)
Date:  10/16/2021   ID:  Alexis Yoder, DOB 01/24/1986, MRN [id number]  PCP:  Shon Hale, MD  Cardiologist:  Rayford Halsted, DO, [facility] (established care May 30, 2021)  Date: 10/16/21 Last Office Visit: 05/30/2021  Chief Complaint  Patient presents with   SYNCOPE    3 MONTH    HPI  Alexis Yoder is a 36 y.o. Caucasian female whose past medical history and cardiovascular risk factors include: Obesity due to excess calories, Vertigo.   She is referred to the office at the request of Shon Hale, * for evaluation of Syncope.  Was initially referred for evaluation of syncope therefore obtained echocardiogram and cardiac monitor.  Echo noted preserved LVEF without significant valvular disease or structural abnormalities and monitor noted when patient triggered event with sinus pause 3.6 seconds.  She has been evaluated by neurology with negative EEGs and MRI studies.  Patient was therefore referred to cardiac electrophysiology.  She was evaluated by Dr. Lalla Brothers in 09/2021 who felt syncope was clearly vasovagal and recommended no further evaluation, but rather counterpressure maneuvers, liberal hydration, and compression stockings. Patient now presents for follow up. Since evaluation and recommendation by Dr. Lalla Brothers patient has had no recurrence of syncope.  Liberal hydration and counter pressures have significantly improved her symptoms.  She does admit to struggling with fluid intake, currently getting about 32 ounces of water per day.  FUNCTIONAL STATUS: Recumbent exercise bike daily  ALLERGIES: Allergies  Allergen Reactions   Nickel Rash and Other (See Comments)    MEDICATION LIST PRIOR TO VISIT: Current Meds  Medication Sig   cetirizine (ZYRTEC) 10 MG tablet Take 10 mg by mouth daily.   meclizine (ANTIVERT) 25 MG tablet Take 25 mg by mouth 3 (three) times daily as needed.   Melatonin 10 MG CAPS Take 2 capsules by mouth at bedtime. Takes 2 gummys at hs    Multiple Vitamin (MULTIVITAMIN) tablet Take 1 tablet by mouth daily.   norethindrone-ethinyl estradiol-FE (LOESTRIN FE) 1-20 MG-MCG tablet Take 1 tablet by mouth daily.     PAST MEDICAL HISTORY: Past Medical History:  Diagnosis Date   Anemia    Kidney stone    Vertigo     PAST SURGICAL HISTORY: Past Surgical History:  Procedure Laterality Date   KNEE SURGERY Left     FAMILY HISTORY: The patient family history includes Breast cancer in her mother; Diabetes in her father.  SOCIAL HISTORY:  The patient  reports that she has never smoked. She does not have any smokeless tobacco history on file. She reports current alcohol use. She reports that she does not use drugs.  REVIEW OF SYSTEMS: Review of Systems  Constitutional: Negative for chills and fever.  HENT:  Negative for hoarse voice and nosebleeds.   Eyes:  Negative for discharge, double vision and pain.  Cardiovascular:  Positive for syncope (no recurrence). Negative for chest pain, claudication, dyspnea on exertion, leg swelling, near-syncope, orthopnea, palpitations and paroxysmal nocturnal dyspnea.  Respiratory:  Negative for hemoptysis and shortness of breath.   Musculoskeletal:  Negative for muscle cramps and myalgias.  Gastrointestinal:  Negative for abdominal pain, constipation, diarrhea, hematemesis, hematochezia, melena, nausea and vomiting.  Neurological:  Negative for dizziness and light-headedness.  All other systems reviewed and are negative.   PHYSICAL EXAM:    10/16/2021    3:12 PM 10/14/2021    2:30 PM 09/11/2021    7:59 AM  Vitals with BMI  Height 5\' 6"   5\' 6"   Weight 245 lbs 6 oz 246 lbs 259 lbs 13 oz  BMI 39.63  41.95  Systolic 126 129 557  Diastolic 82 83 88  Pulse 76 62 86    CONSTITUTIONAL: Well-developed and well-nourished. No acute distress.  SKIN: Skin is warm and dry. No rash noted. No cyanosis. No pallor. No jaundice HEAD: Normocephalic and atraumatic.  EYES: No scleral  icterus MOUTH/THROAT: Moist oral membranes.  NECK: No JVD present. No thyromegaly noted. No carotid bruits  LYMPHATIC: No visible cervical adenopathy.  CHEST Normal respiratory effort. No intercostal retractions  LUNGS: Clear to auscultation bilaterally.  No stridor. No wheezes. No rales.  CARDIOVASCULAR: Regular rate and rhythm, positive S1-S2, no murmurs rubs or gallops appreciated. ABDOMINAL: Obese, soft, nontender, nondistended, positive bowel sounds all 4 quadrants, no apparent ascites.  EXTREMITIES: No peripheral edema, warm to touch, 2+ bilateral DP and PT pulses HEMATOLOGIC: No significant bruising NEUROLOGIC: Oriented to person, place, and time. Nonfocal. Normal muscle tone.  PSYCHIATRIC: Normal mood and affect. Normal behavior. Cooperative Physical exam unchanged compared to previous office visit.  CARDIAC DATABASE: EKG: 05/30/2021: Sinus bradycardia, 52 bpm, without underlying ischemia or injury pattern.  Echocardiogram: 06/11/2021: Left ventricle cavity is normal in size and wall thickness. Normal global wall motion. Normal LV systolic function with EF 56%. Normal diastolic filling pattern. No significant valvular abnormality. Normal right atrial pressure.  Stress Testing: No results found for this or any previous visit from the past 1095 days.   Heart Catheterization: None  14 day extended Holter monitor: Dominant rhythm normal sinus. Heart rate 39-151 bpm. Avg HR 68 bpm. No atrial fibrillation, supraventricular tachycardia, ventricular tachycardia, high grade AV block. Total ventricular ectopic burden <1%. Total supraventricular ectopic burden <1%. Patient triggered events: 1. Underlying rhythm sinus with 3.6-second pause, 07/01/2021 at 7:11 AM.   LABORATORY DATA:    Latest Ref Rng & Units 09/02/2021    4:55 PM 08/25/2021   11:57 AM 10/04/2011    4:19 PM  CBC  WBC 4.0 - 10.5 K/uL 13.6  9.9  10.8   Hemoglobin 12.0 - 15.0 g/dL 32.2  02.5  42.7   Hematocrit 36.0 -  46.0 % 39.4  40.5  37.8   Platelets 150 - 400 K/uL 361  354  281        Latest Ref Rng & Units 09/02/2021    4:55 PM 08/25/2021   11:57 AM 10/04/2011    4:19 PM  CMP  Glucose 70 - 99 mg/dL 062  376  283   BUN 6 - 20 mg/dL 13  10  8    Creatinine 0.44 - 1.00 mg/dL  1.51  7.61   Sodium 135 - 145 mmol/L 134  138  137   Potassium 3.5 - 5.1 mmol/L 4.0  3.8  4.0   Chloride 98 - 111 mmol/L 101  104  101   CO2 22 - 32 mmol/L 22  25  25    Calcium 8.9 - 10.3 mg/dL 8.9  9.5  9.3     Lipid Panel  No results found for: "CHOL", "TRIG", "HDL", "CHOLHDL", "VLDL", "LDLCALC", "LDLDIRECT", "LABVLDL"  No components found for: "NTPROBNP" No results for input(s): "PROBNP" in the last 8760 hours. No results for input(s): "TSH" in the last 8760 hours.  BMP Recent Labs    08/25/21 1157 09/02/21 1655  NA 138 134*  K 3.8 4.0  CL 104 101  CO2 25 22  GLUCOSE 100* 113*  BUN 10 13  CREATININE 0.72 0.61  CALCIUM  9.5 8.9  GFRNONAA >60 >60    HEMOGLOBIN A1C No results found for: "HGBA1C", "MPG"  External Labs: Collected: 05/15/2021 available in Care Everywhere. TSH 2.62. Hemoglobin 13.5 g/dL, hematocrit 34.7%. BUN 14, creatinine 0.9. Sodium 139, potassium 4, chloride 102, bicarb 30. AST 26, ALT 41, alkaline phosphatase 95.  IMPRESSION:    ICD-10-CM   1. Vasovagal syncope  R55        RECOMMENDATIONS: Alexis Yoder is a 36 y.o. Caucasian female whose past medical history and cardiac risk factors include: Obesity due to excess calories, vertigo.  Syncope:  Most likely vasovagal.  Patient is also been evaluated by EP who agrees with likely vasovagal etiology. Echo: Preserved LVEF, normal diastolic function, no significant valvular heart disease.   14-day extended Holter monitor: Average heart rate 60 bpm, 1 patient triggered event during which the underlying rhythm was sinus with 3.6-second pause at 7:11 AM  Reiterated the importance of liberal hydration and sodium intake.  Reviewed  counterpressure maneuvers and encourage patient to continue use of these. No recurrence of syncope since last office visit with electrophysiology.  Vertigo Defer management to primary team.  Class 3 severe obesity due to excess calories without serious comorbidity with body mass index (BMI) of 40.0 to 44.9 in adult [facility]) Body mass index is 39.61 kg/m. She is currently enrolled in weight watchers and has been increasing physical activity.  FINAL MEDICATION LIST END OF ENCOUNTER: No orders of the defined types were placed in this encounter.   There are no discontinued medications.   Current Outpatient Medications:    cetirizine (ZYRTEC) 10 MG tablet, Take 10 mg by mouth daily., Disp: , Rfl:    meclizine (ANTIVERT) 25 MG tablet, Take 25 mg by mouth 3 (three) times daily as needed., Disp: , Rfl:    Melatonin 10 MG CAPS, Take 2 capsules by mouth at bedtime. Takes 2 gummys at hs, Disp: , Rfl:    Multiple Vitamin (MULTIVITAMIN) tablet, Take 1 tablet by mouth daily., Disp: , Rfl:    norethindrone-ethinyl estradiol-FE (LOESTRIN FE) 1-20 MG-MCG tablet, Take 1 tablet by mouth daily., Disp: , Rfl:    fluticasone (FLONASE) 50 MCG/ACT nasal spray, , Disp: , Rfl:   No orders of the defined types were placed in this encounter.   There are no Patient Instructions on file for this visit.   --Continue cardiac medications as reconciled in final medication list. --Return in about 1 year (around 10/17/2022) for vasovagal syncope . Or sooner if needed. --Continue follow-up with your primary care physician regarding the management of your other chronic comorbid conditions.  Patient's questions and concerns were addressed to her satisfaction. She voices understanding of the instructions provided during this encounter.   This note was created using a voice recognition software as a result there may be grammatical errors inadvertently enclosed that do not reflect the nature of this encounter. Every attempt is  made to correct such errors.   Rayford Halsted, PA-C 10/16/2021, 4:12 PM Office: [phone]

## 2021-12-26 ENCOUNTER — Encounter: Payer: Self-pay | Admitting: Neurology

## 2021-12-26 ENCOUNTER — Encounter: Payer: Self-pay | Admitting: Cardiology

## 2021-12-26 NOTE — Telephone Encounter (Signed)
From patient.

## 2022-02-03 ENCOUNTER — Encounter: Payer: Self-pay | Admitting: Neurology

## 2022-02-04 ENCOUNTER — Telehealth: Payer: Self-pay | Admitting: Neurology

## 2022-02-04 DIAGNOSIS — R55 Syncope and collapse: Secondary | ICD-10-CM

## 2022-02-04 NOTE — Addendum Note (Signed)
Addended by: Jake Seats on: 02/04/2022 03:51 PM   Modules accepted: Orders

## 2022-02-04 NOTE — Telephone Encounter (Signed)
Pt said she will wait till 3:30pm today to speak with Dr.Aquino. you can call at the number we have on file

## 2022-02-04 NOTE — Telephone Encounter (Signed)
Spoke to patient. With the recent episode she was looking down at papers on her desk, seated. Had one in Aug, then another one recently. Just the same kind of symptoms as before, wave like feeling. No headaches. Discussed doing a second opinion for recurrent syncope at an academic center. Also discussed the option of doing a therapeutic trial and start seizure medication even if EEGs and MRI normal. She will speak with her parents and let me know if she would like to try this route.   Heather, pls refer to Cardiology at [facility] for second opinion on recurrent syncope. Thanks

## 2022-02-04 NOTE — Telephone Encounter (Signed)
Referral faxed to [facility]

## 2022-02-04 NOTE — Telephone Encounter (Signed)
Pt called in stating she spoke with her parents  and they decided that a trial run with the seizure medication would be appropriate.

## 2022-02-04 NOTE — Telephone Encounter (Signed)
Left VM

## 2022-02-06 MED ORDER — LAMOTRIGINE 25 MG PO TABS: ORAL_TABLET | ORAL | 6 refills | Status: DC

## 2022-02-10 ENCOUNTER — Encounter: Payer: Self-pay | Admitting: Neurology

## 2022-05-13 ENCOUNTER — Encounter: Payer: Self-pay | Admitting: Neurology

## 2022-05-13 ENCOUNTER — Ambulatory Visit: Payer: BC Managed Care – PPO | Admitting: Neurology

## 2022-05-13 VITALS — BP 130/88 | HR 72 | Ht 65.0 in | Wt 241.2 lb

## 2022-05-13 DIAGNOSIS — R55 Syncope and collapse: Secondary | ICD-10-CM

## 2022-05-13 MED ORDER — LAMOTRIGINE 25 MG PO TABS: ORAL_TABLET | ORAL | 6 refills | Status: DC

## 2022-05-13 NOTE — Progress Notes (Signed)
NEUROLOGY FOLLOW UP OFFICE NOTE  Alexis Yoder [id number] 1985-06-26  HISTORY OF PRESENT ILLNESS: I had the pleasure of seeing Alexis Yoder in follow-up in the neurology clinic on 05/13/2022.  The patient was last seen 7 months ago for recurrent syncope. She is alone in the office today.  Records and images were personally reviewed where available.  Since her last visit, she contacted our office about syncopal episodes on 12/26/21 while in the bathroom, she was on the commode then felt the wave sensation and like she was going to be sick, passed out and fell off the toilet. She had another on 02/03/22 at work, she was out for about 2 seconds sitting at her desk, again feeling the same sensation. We discussed doing a therapeutic trial with seizure medication, she was started on Lamotrigine 50mg  BID without side effects. 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 Video EEG monitoring was recommended. Since that visit, she had the wave-like sensation for 1-2 seconds with no loss of consciousness at the end of Oct and week of Thanksgiving. Yesterday she had a really bad headache with a little vertigo, she lay down and vomited. She did not take meclizine and increased hydration. She reports an episode 3 months ago where she had chest heaviness. Her PCP did not think it was anxiety and started her on Methylphenidate for ADD, which does help with attention. She herself does not feel anxious. She has been exercising, wearing compression stockings, and keeping hydrated.    History on Initial Assessment 05/19/2021: This is a pleasant 37 year old right-handed woman with no significant past medical history, presenting for evaluation of 2 episodes of syncope. She is accompanied by her mother who helps supplement the history today. She recalls having a syncopal episode in middle school, her mother  was braiding her hair by the sink and she fell forward. She recalls that she was getting ready to start her period at that time. She only recalls feeling sleepy, no focal weakness. She had another syncopal episode in late high school/early college with no prior warning, she was told she was anemic and took iron pills for a time. She did well for several years until 04/23/2021. She recalls feeling fine that day, she saw her chiropractor who did adjustments (in her back and neck), then stopped by [place] for food. She recalls feeling a little nauseated in the car, which is not unusual with history of carsickness. They were visiting her grandmother in [place] and arrived there feeling hungry. She had dinner and then while sitting felt like the room was spinning. She alerted her mother and recalls feeling very warm. Her mother put a wet washcloth on, she felt fine for 30 minutes and started crocheting, when she again felt the same sensation and lost consciousness, waking up to her mother calling her. Her mother reports that she was sitting and then her head went down and her arms flexed on her chest, no convulsive activity. Episode lasted 15-20 seconds. She woke up and felt warm again, vomiting twice en route to the ER. She recalls having a head CT and EKG that were reportedly normal, diagnosed with vertigo and discharged home on meclizine. Over the next month, she would have brief episodes of feeling "swimmyheaded" and would take meclizine which seemed to curb sensation. No further spinning sensation until 05/10/2021. She felt fine, she had been up  and down steps helping pack the house. At dinner, she ate and was sitting on the recliner when she felt the same spinning sensation. She took meclizine and felt fine for 10-20 minutes. She got up and sat on the kitchen chair then felt the spinning sensation and again lost consciousness also for only a few seconds. She did not fall off the chair, she was able to lay  her head on the back of the chair and felt warm and nauseated when she woke up. Her body felt like she had run a marathon, she slept until the next day. There was a little burning sensation in her chest, no palpitations. She still has a little bit of the swimmy-headed sensation right now when she moves her head back and forth. 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 Her mother denies any staring/unresponsive episodes. She denies any focal numbness/tingling/weakness, myoclonic jerks. She has occasional tension headaches. She denies any neck pain. She usually gets 6-7 hours of sleep, melatonin helps. She denies any sleep deprivation or alcohol prior to the events. Memory is really good. 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 Her grandmother (who was my patient) had seizures. She had a normal birth and early development.  There is no history of febrile convulsions, CNS infections such as meningitis/encephalitis, significant traumatic brain injury, neurosurgical procedures.  Diagnostic Data: Brain MRI with and without contrast, MRA head in 20/2023 no acute changes, hippocampi symmetric, no flow-limiting stenosis seen.    1-hour and 24-hour EEG in 20/2023 were normal, typical events were not captured. Episodes of dizziness and headache did not show EEG change.     PAST MEDICAL HISTORY: Past Medical History:  Diagnosis Date   Anemia    Kidney stone    Vertigo     MEDICATIONS: Current Outpatient Medications on File Prior to Visit  Medication Sig Dispense Refill   cetirizine (ZYRTEC) 10 MG tablet Take 10 mg by mouth daily.     fluticasone (FLONASE) 50 MCG/ACT nasal spray      lamoTRIgine (LAMICTAL) 25 MG tablet Take 1 tablet twice a day for 2 weeks, then increase to 2 tablets  twice a day 120 tablet 6   meclizine (ANTIVERT) 25 MG tablet Take 25 mg by mouth 3 (three) times daily as needed.     Melatonin 10 MG CAPS Take 2 capsules by mouth at bedtime. Takes 2 gummys at hs     Multiple Vitamin (MULTIVITAMIN) tablet Take 1 tablet by mouth daily.     norethindrone-ethinyl estradiol-FE (LOESTRIN FE) 1-20 MG-MCG tablet Take 1 tablet by mouth daily.     No current facility-administered medications on file prior to visit.    ALLERGIES: Allergies  Allergen Reactions   Nickel Rash and Other (See Comments)    FAMILY HISTORY: Family History  Problem Relation Age of Onset   Breast cancer Mother    Diabetes Father     SOCIAL HISTORY: Social History   Socioeconomic History   Marital status: Single    Spouse name: Not on file   Number of children: 0   Years of education: Not on file   Highest education level: Not on file  Occupational History   Occupation: teacher special education  Tobacco Use   Smoking status: Never  Smokeless tobacco: Not on file  Vaping Use   Vaping Use: Never used  Substance and Sexual Activity   Alcohol use: Yes    Comment: Weekly   Drug use: No   Sexual activity: Not on file  Other Topics Concern   Not on file  Social History Narrative   Right handed   Drinks caffeine 12 oz a day   One story home   Social Determinants of Health   Financial Resource Strain: Not on file  Food Insecurity: Not on file  Transportation Needs: Not on file  Physical Activity: Not on file  Stress: Not on file  Social Connections: Not on file  Intimate Partner Violence: Not on file     PHYSICAL EXAM: Vitals:   05/13/22 1457  BP: 130/88  Pulse: 72  SpO2: 100%   General: No acute distress Head:  Normocephalic/atraumatic Skin/Extremities: No rash, no edema Neurological Exam: alert and awake. No aphasia or dysarthria. Fund of knowledge is appropriate.  Attention and concentration are normal.   Cranial nerves: Pupils equal, round.  Extraocular movements intact with no nystagmus. Visual fields full.  No facial asymmetry.  Motor: Bulk and tone normal, muscle strength 5/5 throughout with no pronator drift.   Finger to nose testing intact.  Gait narrow-based and steady, able to tandem walk adequately.  Romberg negative.   IMPRESSION: This is a pleasant 37 yo RH woman with recurrent syncopal episodes. Neurological evaluation normal, including brain MRI/MRA and 24-hour EEG. She had been doing different maneuvers for vasovagal syncope but continued to have syncopal episodes. We discussed doing a therapeutic trial with Lamotrigine in October, she denies any further episodes of loss of consciousness since then but has had a few of the wave-like sensations. 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 Continue Lamotrigine 50mg  BID for now. She is aware of [place] driving laws to stop driving after an episode of loss of consciousness until 6 months event-free. Follow-up in 2 months, call for any changes.   Thank you for allowing me to participate in her care.  Please do not hesitate to call for any questions or concerns.    , M.D.   CC: Dr. Patrcia Dolly

## 2022-05-13 NOTE — Patient Instructions (Signed)
Good to see you. Proceed with EMU admission. Continue Lamotrigine 25mg : take 2 tablets twice a day until instructed otherwise. Follow-up in 2 months, call for any changes

## 2022-07-13 ENCOUNTER — Ambulatory Visit (INDEPENDENT_AMBULATORY_CARE_PROVIDER_SITE_OTHER): Payer: BC Managed Care – PPO | Admitting: Neurology

## 2022-07-13 ENCOUNTER — Encounter: Payer: Self-pay | Admitting: Neurology

## 2022-07-13 VITALS — BP 153/92 | HR 75 | Ht 65.0 in | Wt 233.0 lb

## 2022-07-13 DIAGNOSIS — R55 Syncope and collapse: Secondary | ICD-10-CM | POA: Diagnosis not present

## 2022-07-13 MED ORDER — LAMOTRIGINE 25 MG PO TABS: ORAL_TABLET | ORAL | 6 refills | Status: DC

## 2022-07-13 NOTE — Progress Notes (Signed)
NEUROLOGY FOLLOW UP OFFICE NOTE  Alexis Yoder [id number] 1985/10/02  HISTORY OF PRESENT ILLNESS: I had the pleasure of seeing Alexis Yoder in follow-up in the neurology clinic on 07/13/2022.  The patient was last seen 2 months ago for recurrent syncope. She is again accompanied by her mother who helps supplement the history today.  Records and images were personally reviewed where available. Since her last visit, she was admitted at the [facility] from February 7 to 11, 2024 for characterization. Baseline EEG was normal. She had one episode of feeling the wave sensation with no loss of awareness, EEG normal. EMU was nondiagnostic since typical events were not captured, she was discharged home on Lamotrigine '50mg'$  BID. She reports that she has not had any episodes of loss of consciousness since 02/03/22. Her mother denies any staring/unresponsive episodes. She denies any olfactory/gustatory hallucinations, focal numbness/tingling/weakness, myoclonic jerks. No headaches, dizziness, vision changes, no falls. Sleep is okay. BP today elevated 153/92, she reports stress at work.    History on Initial Assessment 05/19/2021: This is a pleasant 37 year old right-handed woman with no significant past medical history, presenting for evaluation of 2 episodes of syncope. She is accompanied by her mother who helps supplement the history today. She recalls having a syncopal episode in middle school, her mother was braiding her hair by the sink and she fell forward. She recalls that she was getting ready to start her period at that time. She only recalls feeling sleepy, no focal weakness. She had another syncopal episode in late high school/early college with no prior warning, she was told she was anemic and took iron pills for a time. She did well for several years until 04/23/2021. She recalls feeling fine that day, she saw her chiropractor who did adjustments (in her back and neck), then stopped by [place] for  food. She recalls feeling a little nauseated in the car, which is not unusual with history of carsickness. They were visiting her grandmother in [place] and arrived there feeling hungry. She had dinner and then while sitting felt like the room was spinning. She alerted her mother and recalls feeling very warm. Her mother put a wet washcloth on, she felt fine for 30 minutes and started crocheting, when she again felt the same sensation and lost consciousness, waking up to her mother calling her. Her mother reports that she was sitting and then her head went down and her arms flexed on her chest, no convulsive activity. Episode lasted 15-20 seconds. She woke up and felt warm again, vomiting twice en route to the ER. She recalls having a head CT and EKG that were reportedly normal, diagnosed with vertigo and discharged home on meclizine. Over the next month, she would have brief episodes of feeling "swimmyheaded" and would take meclizine which seemed to curb sensation. No further spinning sensation until 05/10/2021. She felt fine, she had been up and down steps helping pack the house. At dinner, she ate and was sitting on the recliner when she felt the same spinning sensation. She took meclizine and felt fine for 10-20 minutes. She got up and sat on the kitchen chair then felt the spinning sensation and again lost consciousness also for only a few seconds. She did not fall off the chair, she was able to lay her head on the back of the chair and felt warm and nauseated when she woke up. Her body felt like she had run a marathon, she slept until the next  day. There was a little burning sensation in her chest, no palpitations. She still has a little bit of the swimmy-headed sensation right now when she moves her head back and forth. 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 Her mother denies any staring/unresponsive episodes. She denies any focal numbness/tingling/weakness, myoclonic jerks. She has occasional tension headaches. She denies any neck pain. She usually gets 6-7 hours of sleep, melatonin helps. She denies any sleep deprivation or alcohol prior to the events. Memory is really good. 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 Her grandmother (who was my patient) had seizures. She had a normal birth and early development.  There is no history of febrile convulsions, CNS infections such as meningitis/encephalitis, significant traumatic brain injury, neurosurgical procedures.  Diagnostic Data: Brain MRI with and without contrast, MRA head in 20/2023 no acute changes, hippocampi symmetric, no flow-limiting stenosis seen.    1-hour and 24-hour EEG in 20/2023 were normal, typical events were not captured. Episodes of dizziness and headache did not show EEG change.    PAST MEDICAL HISTORY: Past Medical History:  Diagnosis Date   Anemia    Kidney stone    Vertigo     MEDICATIONS: Current Outpatient Medications on File Prior to Visit  Medication Sig Dispense Refill   cetirizine (ZYRTEC) 10 MG tablet Take 10 mg by mouth daily.     fluticasone (FLONASE) 50 MCG/ACT nasal spray      lamoTRIgine (LAMICTAL) 25 MG tablet Take 2 tablets twice a day 120 tablet 6   meclizine (ANTIVERT) 25 MG tablet Take 25 mg by mouth 3 (three) times daily as needed.     Melatonin 10 MG CAPS Take 2 capsules by mouth at bedtime. Takes 2 gummys at hs     methylphenidate (RITALIN) 10 MG tablet Take 10 mg by mouth 2 (two) times daily.     Multiple Vitamin (MULTIVITAMIN) tablet Take 1 tablet by mouth daily.     norethindrone-ethinyl estradiol-FE (LOESTRIN FE) 1-20 MG-MCG tablet Take 1 tablet by mouth daily.     No current facility-administered medications on file prior to visit.    ALLERGIES: Allergies  Allergen  Reactions   Nickel Rash and Other (See Comments)    FAMILY HISTORY: Family History  Problem Relation Age of Onset   Breast cancer Mother    Diabetes Father     SOCIAL HISTORY: Social History   Socioeconomic History   Marital status: Single    Spouse name: Not on file   Number of children: 0   Years of education: Not on file   Highest education level: Not on file  Occupational History   Occupation: teacher special education  Tobacco Use   Smoking status: Never   Smokeless tobacco: Not on file  Vaping Use   Vaping Use: Never used  Substance and Sexual Activity   Alcohol use: Yes    Comment: Weekly   Drug use: No   Sexual activity: Not on file  Other Topics Concern   Not on file  Social History Narrative   Right handed   Drinks caffeine 12 oz a day   One story home   Social Determinants of Health   Financial Resource Strain: Not on file  Food Insecurity: Not on file  Transportation Needs: Not on file  Physical Activity: Not on file  Stress: Not on file  Social Connections: Not on file  Intimate Partner Violence: Not on file     PHYSICAL EXAM: Vitals:   07/13/22 1608  BP: (!) 153/92  Pulse: 75  SpO2: 100%   General: No acute distress Head:  Normocephalic/atraumatic Skin/Extremities: No rash, no edema Neurological Exam: alert and awake. No aphasia or dysarthria. Fund of knowledge is appropriate. Attention and concentration are normal.   Cranial nerves: Pupils equal, round. Extraocular movements intact with no nystagmus. Visual fields full.  No facial asymmetry.  Motor: Bulk and tone normal, muscle strength 5/5 throughout with no pronator drift.   Finger to nose testing intact.  Gait narrow-based and steady, able to tandem walk adequately.  Romberg negative.   IMPRESSION: This is a pleasant 37 yo RH woman with recurrent syncopal episodes. Neurological evaluation normal, including brain MRI/MRA, 24-hour EEG, and 5-day EMU monitoring where typical events were  not captured. She is on Lamotrigine for empiric treatment of possible seizures and has not had any episodes of loss of consciousness since 02/03/22. We agreed to continue medication. She is aware of [place] driving laws to stop driving after an episode of loss of consciousness until 6 months event-free. She dropped off DMV forms which we will fill out at the 6 month mark event-free. Follow-up in 4-5 months, call for any changes.   Thank you for allowing me to participate in her care.  Please do not hesitate to call for any questions or concerns.    Ellouise Newer, M.D.   CC: Dr. Lindell Noe

## 2022-07-13 NOTE — Patient Instructions (Signed)
Good to see you doing well. Continue Lamotrigine '25mg'$ : take 2 tablets twice a day. Follow-up in 4-5 months, call for any changes   Seizure Precautions: 1. If medication has been prescribed for you to prevent seizures, take it exactly as directed.  Do not stop taking the medicine without talking to your doctor first, even if you have not had a seizure in a long time.   2. Avoid activities in which a seizure would cause danger to yourself or to others.  Don't operate dangerous machinery, swim alone, or climb in high or dangerous places, such as on ladders, roofs, or girders.  Do not drive unless your doctor says you may.  3. If you have any warning that you may have a seizure, lay down in a safe place where you can't hurt yourself.    4.  No driving for 6 months from last seizure, as per [facility].   Please refer to the following link on the [place] website for more information: http://www.epilepsyfoundation.org/answerplace/Social/driving/drivingu.cfm   5.  Maintain good sleep hygiene. Avoid alcohol.  6.  Notify your neurology if you are planning pregnancy or if you become pregnant.  7.  Contact your doctor if you have any problems that may be related to the medicine you are taking.  8.  Call 911 and bring the patient back to the ED if:        A.  The seizure lasts longer than 5 minutes.       B.  The patient doesn't awaken shortly after the seizure  C.  The patient has new problems such as difficulty seeing, speaking or moving  D.  The patient was injured during the seizure  E.  The patient has a temperature over 102 F (39C)  F.  The patient vomited and now is having trouble breathing

## 2022-07-24 DIAGNOSIS — Z0279 Encounter for issue of other medical certificate: Secondary | ICD-10-CM

## 2022-08-04 ENCOUNTER — Telehealth: Payer: Self-pay | Admitting: Neurology

## 2022-08-04 NOTE — Telephone Encounter (Signed)
Needs to discuss when Dr.Aquino' comes back

## 2022-08-04 NOTE — Telephone Encounter (Signed)
Pt called in and left a message. She stated she has now not had an episode in 6 months and would like to find out how to go about driving again.

## 2022-08-04 NOTE — Telephone Encounter (Signed)
Patient will call back when Delice Lesch comes back

## 2022-10-16 ENCOUNTER — Ambulatory Visit: Payer: BC Managed Care – PPO | Admitting: Student

## 2022-11-17 ENCOUNTER — Encounter: Payer: Self-pay | Admitting: Neurology

## 2022-11-17 ENCOUNTER — Ambulatory Visit: Payer: BC Managed Care – PPO | Admitting: Neurology

## 2022-11-17 VITALS — BP 129/86 | HR 98 | Ht 65.0 in | Wt 223.8 lb

## 2022-11-17 DIAGNOSIS — R55 Syncope and collapse: Secondary | ICD-10-CM | POA: Diagnosis not present

## 2022-11-17 MED ORDER — LAMOTRIGINE 25 MG PO TABS: ORAL_TABLET | ORAL | 6 refills | Status: DC

## 2022-11-17 NOTE — Patient Instructions (Signed)
Good to see you doing well. Continue lamotrigine 25mg : take 2 tablets twice a day. Have fun on your trip! See you in 6 months, call for any changes   Seizure Precautions: 1. If medication has been prescribed for you to prevent seizures, take it exactly as directed.  Do not stop taking the medicine without talking to your doctor first, even if you have not had a seizure in a long time.   2. Avoid activities in which a seizure would cause danger to yourself or to others.  Don't operate dangerous machinery, swim alone, or climb in high or dangerous places, such as on ladders, roofs, or girders.  Do not drive unless your doctor says you may.  3. If you have any warning that you may have a seizure, lay down in a safe place where you can't hurt yourself.    4.  No driving for 6 months from last seizure, as per [facility].   Please refer to the following link on the [employer]ile[employer] of [employer]'s website for more information: http://www.epilepsyfoundation.org/answerplace/Social/driving/drivingu.cfm   5.  Maintain good sleep hygiene. Avoid alcohol  6.  Notify your neurology if you are planning pregnancy or if you become pregnant.  7.  Contact your doctor if you have any problems that may be related to the medicine you are taking.  8.  Call 911 and bring the patient back to the ED if:        A.  The seizure lasts longer than 5 minutes.       B.  The patient doesn't awaken shortly after the seizure  C.  The patient has new problems such as difficulty seeing, speaking or moving  D.  The patient was injured during the seizure  E.  The patient has a temperature over 102 F (39C)  F.  The patient vomited and now is having trouble breathing

## 2022-11-17 NOTE — Progress Notes (Signed)
NEUROLOGY FOLLOW UP OFFICE NOTE  Alexis Yoder [id number] 25-Sep-1985  HISTORY OF PRESENT ILLNESS: I had the pleasure of seeing Alexis Yoder in follow-up in the neurology clinic on 11/17/2022. She is alone in the office today.  The patient was last seen 4 months ago for recurrent syncope. She is happy to report that she is doing well with no syncopal episodes since starting Lamotrigine, last episode was 02/03/22. She is tolerating Lamotrigine 50mg  BID without side effects. Since last visit, she had an EMU follow-up with Neurology at [facility], no further recommendations added. EMU admission in February 2024 showed normal EEG, test was nondiagnostic since typical events were not captured. She denies any staring/unresponsive episodes, gaps in time, olfactory/gustatory hallucinations, focal numbness/tingling/weakness, myoclonic jerks. No headaches, dizziness, vision changes, no falls. Mood is good. She tries to get 6-7 hours of sleep. She is less stressed with summer break but does have a test next week. She will be going on vacation to the [facility] for a week.   History on Initial Assessment 05/19/2021: This is a pleasant 37 year old right-handed woman with no significant past medical history, presenting for evaluation of 2 episodes of syncope. She is accompanied by her mother who helps supplement the history today. She recalls having a syncopal episode in middle school, her mother was braiding her hair by the sink and she fell forward. She recalls that she was getting ready to start her period at that time. She only recalls feeling sleepy, no focal weakness. She had another syncopal episode in late high school/early college with no prior warning, she was told she was anemic and took iron pills for a time. She did well for several years until 04/23/2021. She recalls feeling fine that day, she saw her chiropractor who did adjustments (in her back and neck), then stopped by [employer] for food. She recalls  feeling a little nauseated in the car, which is not unusual with history of carsickness. They were visiting her grandmother in [place] and arrived there feeling hungry. She had dinner and then while sitting felt like the room was spinning. She alerted her mother and recalls feeling very warm. Her mother put a wet washcloth on, she felt fine for 30 minutes and started crocheting, when she again felt the same sensation and lost consciousness, waking up to her mother calling her. Her mother reports that she was sitting and then her head went down and her arms flexed on her chest, no convulsive activity. Episode lasted 15-20 seconds. She woke up and felt warm again, vomiting twice en route to the ER. She recalls having a head CT and EKG that were reportedly normal, diagnosed with vertigo and discharged home on meclizine. Over the next month, she would have brief episodes of feeling "swimmyheaded" and would take meclizine which seemed to curb sensation. No further spinning sensation until 05/10/2021. She felt fine, she had been up and down steps helping pack the house. At dinner, she ate and was sitting on the recliner when she felt the same spinning sensation. She took meclizine and felt fine for 10-20 minutes. She got up and sat on the kitchen chair then felt the spinning sensation and again lost consciousness also for only a few seconds. She did not fall off the chair, she was able to lay her head on the back of the chair and felt warm and nauseated when she woke up. Her body felt like she had run a marathon, she slept until the  next day. There was a little burning sensation in her chest, no palpitations. She still has a little bit of the swimmy-headed sensation right now when she moves her head back and forth. 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 Her mother denies any staring/unresponsive episodes. She denies any focal numbness/tingling/weakness, myoclonic jerks. She has occasional tension headaches. She denies any neck pain. She usually gets 6-7 hours of sleep, melatonin helps. She denies any sleep deprivation or alcohol prior to the events. Memory is really good. 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 Her grandmother (who was my patient) had seizures. She had a normal birth and early development.  There is no history of febrile convulsions, CNS infections such as meningitis/encephalitis, significant traumatic brain injury, neurosurgical procedures.  Diagnostic Data: Brain MRI with and without contrast, MRA head in 20/2023 no acute changes, hippocampi symmetric, no flow-limiting stenosis seen.    1-hour and 24-hour EEG in 20/2023 were normal, typical events were not captured. Episodes of dizziness and headache did not show EEG change.   PAST MEDICAL HISTORY: Past Medical History:  Diagnosis Date   Anemia    Kidney stone    Vertigo     MEDICATIONS: Current Outpatient Medications on File Prior to Visit  Medication Sig Dispense Refill   cetirizine (ZYRTEC) 10 MG tablet Take 10 mg by mouth daily.     fluticasone (FLONASE) 50 MCG/ACT nasal spray      lamoTRIgine (LAMICTAL) 25 MG tablet Take 2 tablets twice a day 120 tablet 6   meclizine (ANTIVERT) 25 MG tablet Take 25 mg by mouth 3 (three) times daily as needed.     Melatonin 10 MG CAPS Take 2 capsules by mouth at bedtime. Takes 2 gummys at hs     methylphenidate (RITALIN) 10 MG tablet Take 10 mg by mouth 2 (two) times daily.     Multiple Vitamin (MULTIVITAMIN) tablet Take 1 tablet by mouth daily.     norethindrone-ethinyl estradiol-FE (LOESTRIN FE) 1-20 MG-MCG tablet Take 1 tablet by mouth daily.     No current facility-administered medications on file prior to visit.    ALLERGIES: Allergies  Allergen Reactions   Nickel  Rash and Other (See Comments)    FAMILY HISTORY: Family History  Problem Relation Age of Onset   Breast cancer Mother    Diabetes Father     SOCIAL HISTORY: Social History   Socioeconomic History   Marital status: Single    Spouse name: Not on file   Number of children: 0   Years of education: Not on file   Highest education level: Not on file  Occupational History   Occupation: teacher special education  Tobacco Use   Smoking status: Never   Smokeless tobacco: Not on file  Vaping Use   Vaping status: Never Used  Substance and Sexual Activity   Alcohol use: Yes    Comment: Weekly   Drug use: No   Sexual activity: Not on file  Other Topics Concern   Not on file  Social History Narrative   Right handed   Drinks caffeine 12 oz a day   One story home   Social Determinants of Health   Financial Resource Strain: Not on file  Food Insecurity: Low Risk  (06/10/2022)  [facility]   Food vital sign    Within the past 12 months, you worried that your food would run out before you got money to buy more: Never true    Within the past 12 months, the food you bought just didn't last and you didn't have money to get more: Not on file  Transportation Needs: Not on file (06/10/2022)  Physical Activity: Not on file  Stress: Not on file  Social Connections: Not on file  Intimate Partner Violence: Low Risk  (06/10/2022)   [facility] from [facility] prior to 07/04/2022., [facility] visits prior to 07/04/2022.   Safety    How often does anyone, including family and friends, physically hurt you?: Never    How often does anyone, including family and friends, insult or talk down to you?: Never    How often does anyone, including family and friends, threaten you with harm?: Never    How often does anyone, including family and friends, scream or curse at you?: Never     PHYSICAL EXAM: Vitals:   11/17/22 1125   BP: 129/86  Pulse: 98  SpO2: 98%   General: No acute distress Head:  Normocephalic/atraumatic Skin/Extremities: No rash, no edema Neurological Exam: alert and awake. No aphasia or dysarthria. Fund of knowledge is appropriate.  Attention and concentration are normal.   Cranial nerves: Pupils equal, round. Extraocular movements intact with no nystagmus. Visual fields full.  No facial asymmetry.  Motor: Bulk and tone normal, muscle strength 5/5 throughout with no pronator drift.   Finger to nose testing intact.  Gait narrow-based and steady, able to tandem walk adequately.  Romberg negative.   IMPRESSION: This is a pleasant 37 yo RH woman with recurrent syncopal episodes. Neurological evaluation normal, including brain MRI/MRA, 24-hour EEG, and 5-day EMU monitoring where typical events were not captured. She has been on Lamotrigine for empiric treatment of possible seizures and has not had any episodes of loss of consciousness since 02/03/22, refills sent for Lamotrigine 50mg  BID. She has been back to driving, DMV clearance received. She is aware of [place] driving laws to stop driving after an episode of loss of consciousness until 6 months event-free. Follow-up in 6 months, call for any changes.   Thank you for allowing me to participate in her care.  Please do not hesitate to call for any questions or concerns.     Alexis Yoder, M.D.  CC: Dr. Chanetta Marshall

## 2023-01-06 ENCOUNTER — Other Ambulatory Visit (HOSPITAL_COMMUNITY)
Admission: RE | Admit: 2023-01-06 | Discharge: 2023-01-06 | Disposition: A | Discharge status: Home / Self Care | Payer: BC Managed Care – PPO | Source: Ambulatory Visit | Attending: Nurse Practitioner | Admitting: Nurse Practitioner

## 2023-01-06 DIAGNOSIS — Z124 Encounter for screening for malignant neoplasm of cervix: Secondary | ICD-10-CM | POA: Insufficient documentation

## 2023-01-08 LAB — CYTOLOGY - PAP
Comment: NEGATIVE
Diagnosis: NEGATIVE
High risk HPV: NEGATIVE

## 2023-06-09 ENCOUNTER — Ambulatory Visit: Payer: 59 | Admitting: Neurology

## 2023-07-13 ENCOUNTER — Other Ambulatory Visit: Payer: Self-pay | Admitting: Neurology

## 2023-11-14 ENCOUNTER — Encounter: Payer: Self-pay | Admitting: Neurology

## 2023-11-15 NOTE — Telephone Encounter (Signed)
 Last OV-11/17/22 Next appt-01/07/24 Please advise

## 2023-11-16 NOTE — Telephone Encounter (Signed)
Scheduled for 7/16.

## 2023-11-17 ENCOUNTER — Ambulatory Visit: Admitting: Neurology

## 2023-11-17 ENCOUNTER — Encounter: Payer: Self-pay | Admitting: Neurology

## 2023-11-17 VITALS — BP 127/93 | HR 87 | Ht 65.0 in | Wt 230.2 lb

## 2023-11-17 DIAGNOSIS — Z0279 Encounter for issue of other medical certificate: Secondary | ICD-10-CM

## 2023-11-17 DIAGNOSIS — R55 Syncope and collapse: Secondary | ICD-10-CM | POA: Diagnosis not present

## 2023-11-17 MED ORDER — LAMOTRIGINE 25 MG PO TABS: 50.0000 mg | ORAL_TABLET | Freq: Two times a day (BID) | ORAL | 11 refills | Status: AC

## 2023-11-17 NOTE — Patient Instructions (Signed)
 It's always good to see you.  Continue Lamotrigine  25mg : take 2 tablets twice a day  2. Follow-up in 1 year, call for any changes   Seizure Precautions: 1. If medication has been prescribed for you to prevent seizures, take it exactly as directed.  Do not stop taking the medicine without talking to your doctor first, even if you have not had a seizure in a long time.   2. Avoid activities in which a seizure would cause danger to yourself or to others.  Don't operate dangerous machinery, swim alone, or climb in high or dangerous places, such as on ladders, roofs, or girders.  Do not drive unless your doctor says you may.  3. If you have any warning that you may have a seizure, lay down in a safe place where you can't hurt yourself.    4.  No driving for 6 months from last seizure, as per [place]  state law.   Please refer to the following link on the Epilepsy Foundation of America's website for more information: http://www.epilepsyfoundation.org/answerplace/Social/driving/drivingu.cfm   5.  Maintain good sleep hygiene. Avoid alcohol.  6.  Contact your doctor if you have any problems that may be related to the medicine you are taking.  7.  Call 911 and bring the patient back to the ED if:        A.  The seizure lasts longer than 5 minutes.       B.  The patient doesn't awaken shortly after the seizure  C.  The patient has new problems such as difficulty seeing, speaking or moving  D.  The patient was injured during the seizure  E.  The patient has a temperature over 102 F (39C)  F.  The patient vomited and now is having trouble breathing

## 2023-11-17 NOTE — Progress Notes (Signed)
 NEUROLOGY FOLLOW UP OFFICE NOTE  Alexis Yoder [id number] [birth year]  HISTORY OF PRESENT ILLNESS: I had the pleasure of seeing Alexis Yoder in follow-up in the neurology clinic on 11/17/2023.  The patient was last seen a year ago for recurrent syncope. She is alone in the office today. Records and images were personally reviewed where available.  She reports overall doing well with no further syncopal episodes since starting Lamotrigine  50mg  BID, last syncope was 02/03/2022. She has had 2 woozy feelings in the past year, none of the wave sensation, just feeling woozy where she would sit down and take deep breaths for 5-10 minutes and symptoms pass. She had one last week and another last May while at her desk. She denies any staring/unresponsive episodes, gaps in time, olfactory/gustatory hallucinations, focal numbness/tingling/weakness, myoclonic jerks. No significant headaches, dizziness, vision changes, no falls. She gets 7+ hours of sleep. Mood is good. She works as a Pension scheme manager. She plans to go to [place] for a week in the summer. No side effects on Lamotrigine .   History on Initial Assessment 05/19/2021: This is a pleasant 38 year old right-handed woman with no significant past medical history, presenting for evaluation of 2 episodes of syncope. She is accompanied by her mother who helps supplement the history today. She recalls having a syncopal episode in middle school, her mother was braiding her hair by the sink and she fell forward. She recalls that she was getting ready to start her period at that time. She only recalls feeling sleepy, no focal weakness. She had another syncopal episode in late high school/early college with no prior warning, she was told she was anemic and took iron pills for a time. She did well for several years until 04/23/2021. She recalls feeling fine that day, she saw her chiropractor who did adjustments (in her back and neck), then stopped by [employer] for  food. She recalls feeling a little nauseated in the car, which is not unusual with history of carsickness. They were visiting her grandmother in [place]  and arrived there feeling hungry. She had dinner and then while sitting felt like the room was spinning. She alerted her mother and recalls feeling very warm. Her mother put a wet washcloth on, she felt fine for 30 minutes and started crocheting, when she again felt the same sensation and lost consciousness, waking up to her mother calling her. Her mother reports that she was sitting and then her head went down and her arms flexed on her chest, no convulsive activity. Episode lasted 15-20 seconds. She woke up and felt warm again, vomiting twice en route to the ER. She recalls having a head CT and EKG that were reportedly normal, diagnosed with vertigo and discharged home on meclizine. Over the next month, she would have brief episodes of feeling swimmyheaded and would take meclizine which seemed to curb sensation. No further spinning sensation until 05/10/2021. She felt fine, she had been up and down steps helping pack the house. At dinner, she ate and was sitting on the recliner when she felt the same spinning sensation. She took meclizine and felt fine for 10-20 minutes. She got up and sat on the kitchen chair then felt the spinning sensation and again lost consciousness also for only a few seconds. She did not fall off the chair, she was able to lay her head on the back of the chair and felt warm and nauseated when she woke up. Her body felt like she had run  a marathon, she slept until the next day. There was a little burning sensation in her chest, no palpitations. She still has a little bit of the swimmy-headed sensation right now when she moves her head back and forth. 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 Her mother reminds her that prior to both events, she said she was smelling something  burning, then the wave came over her. Her mother denies any staring/unresponsive episodes. She denies any focal numbness/tingling/weakness, myoclonic jerks. She has occasional tension headaches. She denies any neck pain. She usually gets 6-7 hours of sleep, melatonin helps. She denies any sleep deprivation or alcohol prior to the events. Memory is really good. 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 Her grandmother (who was my patient) had seizures. She had a normal birth and early development.  There is no history of febrile convulsions, CNS infections such as meningitis/encephalitis, significant traumatic brain injury, neurosurgical procedures.  Diagnostic Data: Brain MRI with and without contrast, MRA head in 20/2023 no acute changes, hippocampi symmetric, no flow-limiting stenosis seen.    1-hour and 24-hour EEG in 20/2023 were normal, typical events were not captured. Episodes of dizziness and headache did not show EEG change.   PAST MEDICAL HISTORY: Past Medical History:  Diagnosis Date   Anemia    Kidney stone    Vertigo     MEDICATIONS: Current Outpatient Medications on File Prior to Visit  Medication Sig Dispense Refill   cetirizine (ZYRTEC) 10 MG tablet Take 10 mg by mouth daily.     fluticasone (FLONASE) 50 MCG/ACT nasal spray      lamoTRIgine  (LAMICTAL ) 25 MG tablet TAKE 2 TABLETS BY MOUTH TWICE DAILY 120 tablet 6   magnesium 30 MG tablet Take 30 mg by mouth 2 (two) times daily.     meclizine (ANTIVERT) 25 MG tablet Take 25 mg by mouth 3 (three) times daily as needed.     methylphenidate (RITALIN) 10 MG tablet Take 10 mg by mouth 2 (two) times daily.     norethindrone-ethinyl estradiol-FE (LOESTRIN FE) 1-20 MG-MCG tablet Take 1 tablet by mouth daily.     No current facility-administered medications on file prior to visit.    ALLERGIES: Allergies  Allergen Reactions   Nickel Rash and Other (See Comments)    FAMILY HISTORY: Family  History  Problem Relation Age of Onset   Breast cancer Mother    Diabetes Father     SOCIAL HISTORY: Social History   Socioeconomic History   Marital status: Single    Spouse name: Not on file   Number of children: 0   Years of education: Not on file   Highest education level: Not on file  Occupational History   Occupation: teacher special education  Tobacco Use   Smoking status: Never   Smokeless tobacco: Not on file  Vaping Use   Vaping status: Never Used  Substance and Sexual Activity   Alcohol use: Yes    Comment: Weekly   Drug use: No   Sexual activity: Not on file  Other Topics Concern   Not on file  Social History Narrative   Right handed   Drinks caffeine 12 oz a day   One story home   Social Drivers of Health   Financial Resource Strain: Not on file  Food Insecurity: Low Risk  (06/10/2022)   [facility]   Hunger Vital  Sign    Within the past 12 months, you worried that your food would run out before you got money to buy more: Never true    Within the past 12 months, the food you bought just didn't last and you didn't have money to get more: Not on file  Transportation Needs: Not on file (06/10/2022)  Physical Activity: Not on file  Stress: Not on file  Social Connections: Not on file  Intimate Partner Violence: Low Risk  (06/10/2022)   Received from [facility] visits prior to 07/04/2022.   Safety    How often does anyone, including family and friends, physically hurt you?: Never    How often does anyone, including family and friends, insult or talk down to you?: Never    How often does anyone, including family and friends, threaten you with harm?: Never    How often does anyone, including family and friends, scream or curse at you?: Never     PHYSICAL EXAM: Vitals:   11/17/23 1105  BP: (!) 127/93  Pulse: 87  SpO2: 96%   General: No acute distress Head:  Normocephalic/atraumatic Skin/Extremities: No rash, no  edema Neurological Exam: alert and awake. No aphasia or dysarthria. Fund of knowledge is appropriate.  Attention and concentration are normal.   Cranial nerves: Pupils equal, round. Extraocular movements intact with no nystagmus. Visual fields full.  No facial asymmetry.  Motor: Bulk and tone normal, muscle strength 5/5 throughout with no pronator drift.   Finger to nose testing intact.  Gait narrow-based and steady, able to tandem walk adequately.  Romberg negative.   IMPRESSION: This is a pleasant 38 yo RH woman with recurrent syncopal episodes. Neurological evaluation normal, including brain MRI/MRA, 24-hour EEG, and 5-day EMU monitoring where typical events were not captured. She has been on Lamotrigine  for empiric treatment of possible seizures and has not had any episodes of loss of consciousness since 02/2022, continue Lamotrigine  50mg  BID. She is aware of [place] driving laws to stop driving after an episode of loss of consciousness until 6 months event-free. Follow-up in 1 year, call for any changes.   Thank you for allowing me to participate in her care.  Please do not hesitate to call for any questions or concerns.    Alexis Yoder, M.D.   CC: Dr. Chrystal

## 2023-11-17 NOTE — Telephone Encounter (Signed)
 Patient paid the $25 form fee for her DMV paperwork. Forms are in Dr. Ellena box

## 2023-11-23 ENCOUNTER — Telehealth: Payer: Self-pay | Admitting: *Deleted

## 2023-11-23 NOTE — Telephone Encounter (Signed)
 Notified pt DMV form is completed, mailed, and copy at the front office for pick-up.

## 2023-11-25 NOTE — Telephone Encounter (Signed)
 Faxed the [facility] form w/ new fax number [phone] and [phone]

## 2024-01-07 ENCOUNTER — Ambulatory Visit: Payer: 59 | Admitting: Neurology

## 2024-03-22 ENCOUNTER — Encounter: Payer: Self-pay | Admitting: Neurology

## 2024-04-24 ENCOUNTER — Encounter: Payer: Self-pay | Admitting: Neurology

## 2024-11-16 ENCOUNTER — Ambulatory Visit: Admitting: Neurology
